# Patient Record
Sex: Female | Born: 1960
Health system: Southern US, Community
[De-identification: ages and names within clinical notes are randomized; demographics above are authoritative.]

## PROBLEM LIST (undated history)

## (undated) DIAGNOSIS — N950 Postmenopausal bleeding: Secondary | ICD-10-CM

## (undated) DIAGNOSIS — F419 Anxiety disorder, unspecified: Secondary | ICD-10-CM

## (undated) DIAGNOSIS — Z780 Asymptomatic menopausal state: Secondary | ICD-10-CM

## (undated) DIAGNOSIS — R739 Hyperglycemia, unspecified: Secondary | ICD-10-CM

## (undated) DIAGNOSIS — F329 Major depressive disorder, single episode, unspecified: Secondary | ICD-10-CM

## (undated) DIAGNOSIS — I10 Essential (primary) hypertension: Secondary | ICD-10-CM

## (undated) DIAGNOSIS — E669 Obesity, unspecified: Secondary | ICD-10-CM

## (undated) DIAGNOSIS — G2581 Restless legs syndrome: Secondary | ICD-10-CM

## (undated) DIAGNOSIS — L0390 Cellulitis, unspecified: Secondary | ICD-10-CM

## (undated) DIAGNOSIS — N926 Irregular menstruation, unspecified: Secondary | ICD-10-CM

## (undated) DIAGNOSIS — J302 Other seasonal allergic rhinitis: Secondary | ICD-10-CM

## (undated) DIAGNOSIS — R5383 Other fatigue: Secondary | ICD-10-CM

## (undated) DIAGNOSIS — E559 Vitamin D deficiency, unspecified: Secondary | ICD-10-CM

## (undated) DIAGNOSIS — E119 Type 2 diabetes mellitus without complications: Secondary | ICD-10-CM

## (undated) HISTORY — DX: Irregular menstruation, unspecified: N92.6

## (undated) HISTORY — DX: Postmenopausal bleeding: N95.0

## (undated) HISTORY — DX: Vitamin D deficiency, unspecified: E55.9

## (undated) HISTORY — DX: Obesity, unspecified: E66.9

## (undated) HISTORY — DX: Major depressive disorder, single episode, unspecified: F32.9

## (undated) HISTORY — DX: Other seasonal allergic rhinitis: J30.2

## (undated) HISTORY — DX: Other fatigue: R53.83

## (undated) HISTORY — DX: Asymptomatic menopausal state: Z78.0

## (undated) HISTORY — DX: Essential (primary) hypertension: I10

## (undated) HISTORY — PX: ANKLE SURGERY: SHX546

## (undated) HISTORY — DX: Cellulitis, unspecified: L03.90

## (undated) HISTORY — DX: Hyperglycemia, unspecified: R73.9

## (undated) HISTORY — DX: Type 2 diabetes mellitus without complications: E11.9

---

## 2001-11-07 ENCOUNTER — Other Ambulatory Visit: Admission: RE | Admit: 2001-11-07 | Discharge: 2001-11-07 | Payer: Self-pay | Admitting: Obstetrics and Gynecology

## 2003-08-02 ENCOUNTER — Ambulatory Visit (HOSPITAL_COMMUNITY): Admission: RE | Admit: 2003-08-02 | Discharge: 2003-08-02 | Payer: Self-pay | Admitting: Specialist

## 2006-08-27 ENCOUNTER — Encounter (HOSPITAL_COMMUNITY): Admission: RE | Admit: 2006-08-27 | Discharge: 2006-09-26 | Payer: Self-pay | Admitting: Orthopaedic Surgery

## 2007-01-27 ENCOUNTER — Ambulatory Visit (HOSPITAL_COMMUNITY): Admission: RE | Admit: 2007-01-27 | Discharge: 2007-01-27 | Payer: Self-pay | Admitting: Specialist

## 2008-01-29 ENCOUNTER — Ambulatory Visit (HOSPITAL_COMMUNITY): Admission: RE | Admit: 2008-01-29 | Discharge: 2008-01-29 | Payer: Self-pay | Admitting: Obstetrics and Gynecology

## 2008-02-05 ENCOUNTER — Other Ambulatory Visit: Admission: RE | Admit: 2008-02-05 | Discharge: 2008-02-05 | Payer: Self-pay | Admitting: Obstetrics and Gynecology

## 2009-01-31 ENCOUNTER — Ambulatory Visit (HOSPITAL_COMMUNITY): Admission: RE | Admit: 2009-01-31 | Discharge: 2009-01-31 | Payer: Self-pay | Admitting: Obstetrics & Gynecology

## 2009-05-27 ENCOUNTER — Other Ambulatory Visit: Admission: RE | Admit: 2009-05-27 | Discharge: 2009-05-27 | Payer: Self-pay | Admitting: Obstetrics and Gynecology

## 2010-02-14 ENCOUNTER — Ambulatory Visit (HOSPITAL_COMMUNITY): Admission: RE | Admit: 2010-02-14 | Discharge: 2010-02-14 | Payer: Self-pay | Admitting: Obstetrics and Gynecology

## 2010-09-02 ENCOUNTER — Encounter: Payer: Self-pay | Admitting: Specialist

## 2010-09-19 ENCOUNTER — Other Ambulatory Visit (HOSPITAL_COMMUNITY)
Admission: RE | Admit: 2010-09-19 | Discharge: 2010-09-19 | Disposition: A | Discharge status: Home / Self Care | Payer: BC Managed Care – PPO | Source: Ambulatory Visit | Attending: Obstetrics and Gynecology | Admitting: Obstetrics and Gynecology

## 2010-09-19 ENCOUNTER — Other Ambulatory Visit: Payer: Self-pay | Admitting: Adult Health

## 2010-09-19 DIAGNOSIS — Z01419 Encounter for gynecological examination (general) (routine) without abnormal findings: Secondary | ICD-10-CM | POA: Insufficient documentation

## 2010-10-30 ENCOUNTER — Other Ambulatory Visit: Payer: Self-pay | Admitting: Obstetrics & Gynecology

## 2010-10-30 DIAGNOSIS — N946 Dysmenorrhea, unspecified: Secondary | ICD-10-CM

## 2010-11-02 ENCOUNTER — Other Ambulatory Visit: Payer: Self-pay | Admitting: Obstetrics & Gynecology

## 2010-11-02 ENCOUNTER — Ambulatory Visit (HOSPITAL_COMMUNITY)
Admission: RE | Admit: 2010-11-02 | Discharge: 2010-11-02 | Disposition: A | Discharge status: Home / Self Care | Payer: BC Managed Care – PPO | Source: Ambulatory Visit | Attending: Obstetrics & Gynecology | Admitting: Obstetrics & Gynecology

## 2010-11-02 ENCOUNTER — Other Ambulatory Visit (HOSPITAL_COMMUNITY): Payer: BC Managed Care – PPO

## 2010-11-02 DIAGNOSIS — N946 Dysmenorrhea, unspecified: Secondary | ICD-10-CM

## 2010-11-02 DIAGNOSIS — N926 Irregular menstruation, unspecified: Secondary | ICD-10-CM | POA: Insufficient documentation

## 2010-11-03 ENCOUNTER — Ambulatory Visit (HOSPITAL_COMMUNITY): Payer: BC Managed Care – PPO

## 2011-01-26 ENCOUNTER — Other Ambulatory Visit: Payer: Self-pay | Admitting: Obstetrics and Gynecology

## 2011-01-26 DIAGNOSIS — Z139 Encounter for screening, unspecified: Secondary | ICD-10-CM

## 2011-02-19 ENCOUNTER — Ambulatory Visit (HOSPITAL_COMMUNITY): Payer: BC Managed Care – PPO

## 2011-03-12 ENCOUNTER — Ambulatory Visit (HOSPITAL_COMMUNITY)
Admission: RE | Admit: 2011-03-12 | Discharge: 2011-03-12 | Disposition: A | Discharge status: Home / Self Care | Payer: BC Managed Care – PPO | Source: Ambulatory Visit | Attending: Obstetrics and Gynecology | Admitting: Obstetrics and Gynecology

## 2011-03-12 DIAGNOSIS — Z1231 Encounter for screening mammogram for malignant neoplasm of breast: Secondary | ICD-10-CM | POA: Insufficient documentation

## 2011-03-12 DIAGNOSIS — Z139 Encounter for screening, unspecified: Secondary | ICD-10-CM

## 2011-10-24 ENCOUNTER — Other Ambulatory Visit: Payer: Self-pay | Admitting: Adult Health

## 2011-10-24 ENCOUNTER — Other Ambulatory Visit (HOSPITAL_COMMUNITY)
Admission: RE | Admit: 2011-10-24 | Discharge: 2011-10-24 | Disposition: A | Discharge status: Home / Self Care | Payer: BC Managed Care – PPO | Source: Ambulatory Visit | Attending: Obstetrics and Gynecology | Admitting: Obstetrics and Gynecology

## 2011-10-24 DIAGNOSIS — Z01419 Encounter for gynecological examination (general) (routine) without abnormal findings: Secondary | ICD-10-CM | POA: Insufficient documentation

## 2012-03-24 ENCOUNTER — Other Ambulatory Visit: Payer: Self-pay | Admitting: Obstetrics and Gynecology

## 2012-03-24 DIAGNOSIS — Z139 Encounter for screening, unspecified: Secondary | ICD-10-CM

## 2012-04-01 ENCOUNTER — Ambulatory Visit (HOSPITAL_COMMUNITY): Payer: BC Managed Care – PPO

## 2012-04-18 ENCOUNTER — Ambulatory Visit (HOSPITAL_COMMUNITY)
Admission: RE | Admit: 2012-04-18 | Discharge: 2012-04-18 | Disposition: A | Discharge status: Home / Self Care | Payer: BC Managed Care – PPO | Source: Ambulatory Visit | Attending: Obstetrics and Gynecology | Admitting: Obstetrics and Gynecology

## 2012-04-18 DIAGNOSIS — Z139 Encounter for screening, unspecified: Secondary | ICD-10-CM

## 2012-04-18 DIAGNOSIS — Z1231 Encounter for screening mammogram for malignant neoplasm of breast: Secondary | ICD-10-CM | POA: Insufficient documentation

## 2012-04-22 ENCOUNTER — Other Ambulatory Visit: Payer: Self-pay | Admitting: Obstetrics and Gynecology

## 2012-04-22 DIAGNOSIS — R928 Other abnormal and inconclusive findings on diagnostic imaging of breast: Secondary | ICD-10-CM

## 2012-05-28 ENCOUNTER — Ambulatory Visit (HOSPITAL_COMMUNITY)
Admission: RE | Admit: 2012-05-28 | Discharge: 2012-05-28 | Disposition: A | Discharge status: Home / Self Care | Payer: BC Managed Care – PPO | Source: Ambulatory Visit | Attending: Obstetrics and Gynecology | Admitting: Obstetrics and Gynecology

## 2012-05-28 DIAGNOSIS — R928 Other abnormal and inconclusive findings on diagnostic imaging of breast: Secondary | ICD-10-CM

## 2012-05-28 DIAGNOSIS — N63 Unspecified lump in unspecified breast: Secondary | ICD-10-CM | POA: Insufficient documentation

## 2012-11-19 ENCOUNTER — Encounter: Payer: Self-pay | Admitting: *Deleted

## 2012-11-19 DIAGNOSIS — E669 Obesity, unspecified: Secondary | ICD-10-CM | POA: Insufficient documentation

## 2012-11-20 ENCOUNTER — Other Ambulatory Visit: Payer: Self-pay | Admitting: Adult Health

## 2012-12-22 ENCOUNTER — Encounter: Payer: Self-pay | Admitting: Adult Health

## 2012-12-22 ENCOUNTER — Other Ambulatory Visit (HOSPITAL_COMMUNITY)
Admission: RE | Admit: 2012-12-22 | Discharge: 2012-12-22 | Disposition: A | Discharge status: Home / Self Care | Payer: BC Managed Care – PPO | Source: Ambulatory Visit | Attending: Adult Health | Admitting: Adult Health

## 2012-12-22 ENCOUNTER — Ambulatory Visit (INDEPENDENT_AMBULATORY_CARE_PROVIDER_SITE_OTHER): Payer: BC Managed Care – PPO | Admitting: Adult Health

## 2012-12-22 VITALS — BP 130/80 | Ht 63.5 in | Wt 227.0 lb

## 2012-12-22 DIAGNOSIS — Z01419 Encounter for gynecological examination (general) (routine) without abnormal findings: Secondary | ICD-10-CM | POA: Insufficient documentation

## 2012-12-22 DIAGNOSIS — Z1212 Encounter for screening for malignant neoplasm of rectum: Secondary | ICD-10-CM

## 2012-12-22 DIAGNOSIS — Z1151 Encounter for screening for human papillomavirus (HPV): Secondary | ICD-10-CM | POA: Insufficient documentation

## 2012-12-22 DIAGNOSIS — L0390 Cellulitis, unspecified: Secondary | ICD-10-CM

## 2012-12-22 HISTORY — DX: Cellulitis, unspecified: L03.90

## 2012-12-22 LAB — HEMOCCULT GUIAC POC 1CARD (OFFICE): Fecal Occult Blood, POC: NEGATIVE

## 2012-12-22 MED ORDER — HYDROCHLOROTHIAZIDE 12.5 MG PO CAPS: 12.5000 mg | ORAL_CAPSULE | Freq: Every day | ORAL | Status: DC

## 2012-12-22 MED ORDER — DOXYCYCLINE HYCLATE 100 MG PO CAPS: 100.0000 mg | ORAL_CAPSULE | Freq: Two times a day (BID) | ORAL | Status: DC

## 2012-12-22 NOTE — Progress Notes (Signed)
Patient ID: Allison Goodman, female   DOB: 1960-12-09, 52 y.o.   MRN: [id number] History of Present Illness: Allison Goodman is a 52 year old white female in for pap and physical. She works at [employer].  Current Medications, Allergies, Past Medical History, Past Surgical History, Family History and Social History were reviewed in [facility] [employer] record.     Review of Systems:Patient denies any headaches, blurred vision, shortness of breath, chest pain, abdominal pain, problems with bowel movements, urination, she is not having sex.She does complain of swelling in her right ankle, which she broke in 2008 and has hardware in, she. She is having irregular periods and some mood swings and occasional hot flashes.   Physical Exam:Blood pressure 130/80, height 5' 3.5" (1.613 m), weight 227 lb (102.967 kg), last menstrual period 11/19/2012. General:  Well developed, well nourished, no acute distress Skin:  Warm and dry Neck:  Midline trachea, normal thyroid Lungs; Clear to auscultation bilaterally Breast:  No dominant palpable mass, retraction, or nipple discharge Cardiovascular: Regular rate and rhythm Abdomen:  Soft, non tender, no hepatosplenomegaly Pelvic:  External genitalia is normal in appearance.  The vagina is normal in appearance. The cervix is bulbous. Pap performed with HPV>  Uterus is felt to be normal size, shape, and contour.  No adnexal masses or tenderness noted. Rectal: Good sphincter tone, no polyps, or hemorrhoids felt.  Hemoccult negative. Extremities: She has pitting edema in right ankle and redness, there is scab at top of scar and some weeping, this has gotten worse in last 3-4 months. Left ankle has mild swelling, no redness. Dr. Despina Goodman in see leg and agrees with referral and antibiotic. Psych: Moody at times, pleasant and cooperative today.  Impression: Yearly exam Cellulitis right leg  Plan: Physical in 1 year  Mammogram in October Labs at work in  June Refer to Dr. Hilda Goodman 5/13 at 3 pm Rx doxycycline 100 mg 1 bid x 14 days and microzide 12.5 mg 1 daily

## 2012-12-22 NOTE — Patient Instructions (Addendum)
TAke doxy 1 bid x 14 days Take HCTZ 1 daily See Dr. Hilda Lias 5/13 at 3 pm after work as soon as can get there Sign up for my chart Return in 1 year for physical

## 2013-03-19 ENCOUNTER — Encounter (HOSPITAL_COMMUNITY): Payer: Self-pay | Admitting: *Deleted

## 2013-03-19 ENCOUNTER — Inpatient Hospital Stay (HOSPITAL_COMMUNITY)
Admission: EM | Admit: 2013-03-19 | Discharge: 2013-03-22 | DRG: 277 | Disposition: A | Discharge status: Home / Self Care | Payer: BC Managed Care – PPO | Attending: Internal Medicine | Admitting: Internal Medicine

## 2013-03-19 ENCOUNTER — Telehealth: Payer: Self-pay | Admitting: Family Medicine

## 2013-03-19 DIAGNOSIS — L03115 Cellulitis of right lower limb: Secondary | ICD-10-CM | POA: Diagnosis present

## 2013-03-19 DIAGNOSIS — I10 Essential (primary) hypertension: Secondary | ICD-10-CM | POA: Diagnosis present

## 2013-03-19 DIAGNOSIS — D72829 Elevated white blood cell count, unspecified: Secondary | ICD-10-CM | POA: Diagnosis present

## 2013-03-19 DIAGNOSIS — E669 Obesity, unspecified: Secondary | ICD-10-CM | POA: Diagnosis present

## 2013-03-19 DIAGNOSIS — E876 Hypokalemia: Secondary | ICD-10-CM | POA: Diagnosis present

## 2013-03-19 DIAGNOSIS — E119 Type 2 diabetes mellitus without complications: Secondary | ICD-10-CM | POA: Diagnosis present

## 2013-03-19 DIAGNOSIS — L0390 Cellulitis, unspecified: Secondary | ICD-10-CM

## 2013-03-19 DIAGNOSIS — Z6837 Body mass index (BMI) 37.0-37.9, adult: Secondary | ICD-10-CM

## 2013-03-19 DIAGNOSIS — L02419 Cutaneous abscess of limb, unspecified: Principal | ICD-10-CM | POA: Diagnosis present

## 2013-03-19 LAB — BASIC METABOLIC PANEL
BUN: 15 mg/dL (ref 6–23)
CO2: 30 mEq/L (ref 19–32)
Calcium: 9.7 mg/dL (ref 8.4–10.5)
Chloride: 101 mEq/L (ref 96–112)
Creatinine, Ser: 0.67 mg/dL (ref 0.50–1.10)
GFR calc Af Amer: >90 mL/min (ref >90)
GFR calc non Af Amer: >90 mL/min (ref >90)
Glucose, Bld: 114 mg/dL — ABNORMAL HIGH (ref 70–99)
Potassium: 3.4 mEq/L — ABNORMAL LOW (ref 3.5–5.1)
Sodium: 141 mEq/L (ref 135–145)

## 2013-03-19 LAB — HEPATIC FUNCTION PANEL
Bilirubin, Direct: <0.1 mg/dL (ref 0.0–0.3)
Total Bilirubin: 0.3 mg/dL (ref 0.3–1.2)
Total Protein: 8.4 g/dL — ABNORMAL HIGH (ref 6.0–8.3)

## 2013-03-19 LAB — CBC WITH DIFFERENTIAL/PLATELET
Basophils Absolute: 0 10*3/uL (ref 0.0–0.1)
Basophils Relative: 0 % (ref 0–1)
Eosinophils Absolute: 0.1 10*3/uL (ref 0.0–0.7)
Eosinophils Relative: 1 % (ref 0–5)
HCT: 39.9 % (ref 36.0–46.0)
Hemoglobin: 13.6 g/dL (ref 12.0–15.0)
Lymphocytes Relative: 14 % (ref 12–46)
Lymphs Abs: 2.1 10*3/uL (ref 0.7–4.0)
MCH: 29.9 pg (ref 26.0–34.0)
MCHC: 34.1 g/dL (ref 30.0–36.0)
MCV: 87.7 fL (ref 78.0–100.0)
Monocytes Absolute: 1.8 10*3/uL — ABNORMAL HIGH (ref 0.1–1.0)
Monocytes Relative: 12 % (ref 3–12)
Neutro Abs: 11 10*3/uL — ABNORMAL HIGH (ref 1.7–7.7)
Neutrophils Relative %: 73 % (ref 43–77)
Platelets: 236 10*3/uL (ref 150–400)
RBC: 4.55 MIL/uL (ref 3.87–5.11)
RDW: 12.5 % (ref 11.5–15.5)
WBC: 15 10*3/uL — ABNORMAL HIGH (ref 4.0–10.5)

## 2013-03-19 LAB — URINALYSIS, ROUTINE W REFLEX MICROSCOPIC
Bilirubin Urine: NEGATIVE
Glucose, UA: NEGATIVE mg/dL

## 2013-03-19 LAB — URINE MICROSCOPIC-ADD ON

## 2013-03-19 MED ORDER — RISAQUAD PO CAPS
2.0000 | ORAL_CAPSULE | Freq: Every day | ORAL | Status: DC
  Administered 2013-03-20 – 2013-03-22 (×3): 2 via ORAL
  Filled 2013-03-19: qty 2
  Filled 2013-03-19: qty 1
  Filled 2013-03-19 (×2): qty 2

## 2013-03-19 MED ORDER — IBUPROFEN 600 MG PO TABS
600.0000 mg | ORAL_TABLET | Freq: Four times a day (QID) | ORAL | Status: DC | PRN
  Administered 2013-03-20: 600 mg via ORAL
  Filled 2013-03-19 (×2): qty 1

## 2013-03-19 MED ORDER — ACETAMINOPHEN 325 MG PO TABS
650.0000 mg | ORAL_TABLET | ORAL | Status: DC | PRN
  Administered 2013-03-21 – 2013-03-22 (×3): 650 mg via ORAL
  Filled 2013-03-19 (×3): qty 2

## 2013-03-19 MED ORDER — TRAZODONE HCL 50 MG PO TABS: 50.0000 mg | ORAL_TABLET | Freq: Every evening | ORAL | Status: DC | PRN

## 2013-03-19 MED ORDER — OXYCODONE-ACETAMINOPHEN 5-325 MG PO TABS
1.0000 | ORAL_TABLET | Freq: Once | ORAL | Status: AC
  Administered 2013-03-19: 1 via ORAL
  Filled 2013-03-19: qty 1

## 2013-03-19 MED ORDER — CLINDAMYCIN PHOSPHATE 600 MG/50ML IV SOLN: 600.0000 mg | Freq: Once | INTRAVENOUS | Status: DC

## 2013-03-19 MED ORDER — PIPERACILLIN-TAZOBACTAM 3.375 G IVPB
3.3750 g | Freq: Three times a day (TID) | INTRAVENOUS | Status: DC
  Administered 2013-03-20 – 2013-03-21 (×5): 3.375 g via INTRAVENOUS
  Filled 2013-03-19 (×11): qty 50

## 2013-03-19 MED ORDER — ONDANSETRON HCL 4 MG/2ML IJ SOLN: 4.0000 mg | INTRAMUSCULAR | Status: DC | PRN

## 2013-03-19 MED ORDER — SODIUM CHLORIDE 0.9 % IV BOLUS (SEPSIS)
500.0000 mL | Freq: Once | INTRAVENOUS | Status: AC
  Administered 2013-03-19: 500 mL via INTRAVENOUS

## 2013-03-19 MED ORDER — ENOXAPARIN SODIUM 40 MG/0.4ML ~~LOC~~ SOLN
40.0000 mg | SUBCUTANEOUS | Status: DC
  Administered 2013-03-20 – 2013-03-21 (×3): 40 mg via SUBCUTANEOUS
  Filled 2013-03-19 (×3): qty 0.4

## 2013-03-19 MED ORDER — CLINDAMYCIN PHOSPHATE 600 MG/50ML IV SOLN
600.0000 mg | Freq: Once | INTRAVENOUS | Status: AC
  Administered 2013-03-19: 600 mg via INTRAVENOUS
  Filled 2013-03-19: qty 50

## 2013-03-19 MED ORDER — VANCOMYCIN HCL IN DEXTROSE 750-5 MG/150ML-% IV SOLN
750.0000 mg | Freq: Three times a day (TID) | INTRAVENOUS | Status: DC
  Administered 2013-03-20 – 2013-03-21 (×4): 750 mg via INTRAVENOUS
  Filled 2013-03-19 (×10): qty 150

## 2013-03-19 MED ORDER — VANCOMYCIN HCL IN DEXTROSE 1-5 GM/200ML-% IV SOLN
1000.0000 mg | Freq: Once | INTRAVENOUS | Status: AC
  Administered 2013-03-20: 1000 mg via INTRAVENOUS
  Filled 2013-03-19: qty 200

## 2013-03-19 MED ORDER — POLYETHYLENE GLYCOL 3350 17 G PO PACK: 17.0000 g | PACK | Freq: Every day | ORAL | Status: DC | PRN

## 2013-03-19 MED ORDER — FLEET ENEMA 7-19 GM/118ML RE ENEM: 1.0000 | ENEMA | Freq: Once | RECTAL | Status: AC | PRN

## 2013-03-19 MED ORDER — SORBITOL 70 % SOLN: 30.0000 mL | Freq: Every day | Status: DC | PRN

## 2013-03-19 NOTE — H&P (Signed)
[facility] History and Physical  Allison Goodman  WUJ:[id number]  DOB: 01-15-1961   DOA: 03/19/2013   PCP:   No PCP Per Patient   Chief Complaint:  Pain and swelling of the right leg for 3  HPI: Allison [employer] is a 52 y.o. female.   Obese Caucasian lady with a history of remote fracture of right ankle about 6 years, status post internal fixation with subsequent right leg swelling since then. Episode of cellulitis of the right leg 3 months ago eventually resolved with one month of doxycycline. Has now developed painful swelling and redness of the right leg after trauma 3 days ago. Denies chills, but has been having generalized aches and low-grade fever as a feeling fluids coming.  In the emergency room and noted to have elevated white and tender warm right leg     Rewiew of Systems:   All systems negative except as marked bold or noted in the HPI;  Constitutional:    malaise, fever and chills. ;  Eyes:   eye pain, redness and discharge. ;  ENMT:   ear pain, hoarseness, nasal congestion, sinus pressure and sore throat. ;  Cardiovascular:    chest pain, palpitations, diaphoresis, dyspnea and peripheral edema.  Respiratory:   cough, hemoptysis, wheezing and stridor. ;  Gastrointestinal: anorexia nausea, vomiting, diarrhea, constipation, abdominal pain, melena, blood in stool, hematemesis, jaundice and rectal bleeding. unusual weight loss..   Genitourinary:    frequency, dysuria, incontinence,flank pain and hematuria; Musculoskeletal:   back pain and neck pain.  swelling and trauma.;  Skin: .  pruritus, rash leg, abrasions, bruising and skin lesion.; ulcerations Neuro:    headache, lightheadedness and neck stiffness.  weakness, altered level of consciousness, altered mental status, extremity weakness, burning feet, involuntary movement, seizure and syncope.  Psych:    anxiety, depression, insomnia, tearfulness, panic attacks, hallucinations, paranoia, suicidal or homicidal  ideation    Past Medical History  Diagnosis Date  . Obesity   . Elevated blood sugar   . Cellulitis 12/22/2012    Rx doxy and refer to Dr. Hilda Lias may need hard ware removed  . Hypertension     Past Surgical History  Procedure Laterality Date  . Ankle surgery Right     Medications:  HOME MEDS: Prior to Admission medications   Medication Sig Start Date End Date Taking? Authorizing Provider  acetaminophen (TYLENOL) 500 MG tablet Take by mouth every 6 (six) hours as needed for pain.   Yes Historical Provider, MD  diphenhydrAMINE (BENADRYL) 25 MG tablet Take 25 mg by mouth once.   Yes Historical Provider, MD  hydrochlorothiazide (MICROZIDE) 12.5 MG capsule Take 1 capsule (12.5 mg total) by mouth daily. 12/22/12  Yes Adline Potter, NP  naproxen sodium (ALEVE) 220 MG tablet Take 220 mg by mouth 2 (two) times daily.   Yes Historical Provider, MD     Allergies:  Allergies  Allergen Reactions  . Morphine And Related Nausea And Vomiting    Social History:   reports that she has never smoked. She has never used smokeless tobacco. She reports that she does not drink alcohol or use illicit drugs.  Family History: Family History  Problem Relation Age of Onset  . COPD Mother   . Diabetes Mother   . Parkinson's disease Father   . Hypertension Other   . Diabetes Brother   . Mitral valve prolapse Sister   . Diabetes Brother   . Diabetes Sister      Physical Exam:  Filed Vitals:   03/19/13 1745 03/19/13 2023  BP: 148/87 137/70  Pulse: 114 86  Temp: 98.7 F (37.1 C) 99.7 F (37.6 C)  TempSrc: Oral Oral  Resp:  18  Height: 5' 3.5" (1.613 m)   Weight: 97.977 kg (216 lb)   SpO2: 99% 97%   Blood pressure 137/70, pulse 86, temperature 99.7 F (37.6 C), temperature source Oral, resp. rate 18, height 5' 3.5" (1.613 m), weight 97.977 kg (216 lb), last menstrual period 02/24/2013, SpO2 97.00%. Body mass index is 37.66 kg/(m^2).   GEN:  Pleasant middle-aged Caucasian lady  lying bed in no acute distress; cooperative with exam PSYCH:  alert and oriented x4;  anxious nor depressed; affect is appropriate. HEENT: Mucous membranes pink and anicteric; PERRLA; EOM intact; no cervical lymphadenopathy nor thyromegaly or carotid bruit; no JVD; Breasts:: Not examined CHEST WALL: No tenderness CHEST: Normal respiration, clear to auscultation bilaterally HEART: Regular rate and rhythm; no murmurs rubs or gallops BACK: No kyphosis no scoliosis; no CVA tenderness ABDOMEN: Obese, soft non-tender; no masses, no organomegaly, normal abdominal bowel sounds;  no intertriginous candida. Rectal Exam: Not done EXTREMITIES: age-appropriate arthropathy of the hands and knees; Right ZOX:WRUEA/[id number]; erythema and distal 2/3; welled healed scars around right ankle. no ulcerations. Genitalia: not examined PULSES: 2+ and symmetric SKIN: Normal hydration no rash or ulceration CNS: Cranial nerves 2-12 grossly intact no focal lateralizing neurologic deficit   Labs on Admission:  Basic Metabolic Panel:  Recent Labs Lab 03/19/13 1843  NA 141  K 3.4*  CL 101  CO2 30  GLUCOSE 114*  BUN 15  CREATININE 0.67  CALCIUM 9.7   Liver Function Tests: No results found for this basename: AST, ALT, ALKPHOS, BILITOT, PROT, ALBUMIN,  in the last 168 hours No results found for this basename: LIPASE, AMYLASE,  in the last 168 hours No results found for this basename: AMMONIA,  in the last 168 hours CBC:  Recent Labs Lab 03/19/13 1843  WBC 15.0*  NEUTROABS 11.0*  HGB 13.6  HCT 39.9  MCV 87.7  PLT 236   Cardiac Enzymes: No results found for this basename: CKTOTAL, CKMB, CKMBINDEX, TROPONINI,  in the last 168 hours BNP: No components found with this basename: POCBNP,  D-dimer: No components found with this basename: D-DIMER,  CBG: No results found for this basename: GLUCAP,  in the last 168 hours  Radiological Exams on Admission: No results  found.    Assessment/Plan    Active Problems:   Obesity   Cellulitis of right leg    PLAN: Admit to start intravenous antibiotics Counseled on weight management including diet and exercise  Other plans as per orders.  Code Status: Full code Family Communication:  Disposition Plan: Likely home in a few days    Santosh Petter Nocturnist [facility] Pager [phone]   03/19/2013, 10:09 PM

## 2013-03-19 NOTE — ED Notes (Signed)
Reports redness and swelling to lateral RLE

## 2013-03-19 NOTE — Progress Notes (Signed)
ANTIBIOTIC CONSULT NOTE - INITIAL  Pharmacy Consult for  Vancomycin & Zosyn  Indication:  Cellulitis right leg  Allergies  Allergen Reactions  . Morphine And Related Nausea And Vomiting    Patient Measurements: Height: 5' 3.5" (161.3 cm) Weight: 218 lb 0.6 oz (98.9 kg) IBW/kg (Calculated) : 53.55 Adjusted Body Weight: 69 kg  Vital Signs: Temp: 98.5 F (36.9 C) (08/07 2200) Temp src: Oral (08/07 2200) BP: 146/87 mmHg (08/07 2200) Pulse Rate: 84 (08/07 2200) Intake/Output from previous day:   Intake/Output from this shift:    Labs:  Recent Labs  03/19/13 1843  WBC 15.0*  HGB 13.6  PLT 236  CREATININE 0.67   Estimated Creatinine Clearance: 96.3 ml/min (by C-G formula based on Cr of 0.67). No results found for this basename: VANCOTROUGH, Leodis Binet, VANCORANDOM, GENTTROUGH, GENTPEAK, GENTRANDOM, TOBRATROUGH, TOBRAPEAK, TOBRARND, AMIKACINPEAK, AMIKACINTROU, AMIKACIN,  in the last 72 hours   Microbiology: Recent Results (from the past 720 hour(s))  CULTURE, BLOOD (ROUTINE X 2)     Status: None   Collection Time    03/19/13  8:21 PM      Result Value Range Status   Specimen Description BLOOD LEFT ANTECUBITAL   Final   Special Requests     Final   Value: BOTTLES DRAWN AEROBIC AND ANAEROBIC AEB 15CC ANA 13CC   Culture PENDING   Incomplete   Report Status PENDING   Incomplete  CULTURE, BLOOD (ROUTINE X 2)     Status: None   Collection Time    03/19/13  8:26 PM      Result Value Range Status   Specimen Description BLOOD LEFT HAND   Final   Special Requests     Final   Value: BOTTLES DRAWN AEROBIC AND ANAEROBIC AEB 8CC ANA 10CC   Culture PENDING   Incomplete   Report Status PENDING   Incomplete    Medical History: Past Medical History  Diagnosis Date  . Obesity   . Elevated blood sugar   . Cellulitis 12/22/2012    Rx doxy and refer to Dr. Hilda Lias may need hard ware removed  . Hypertension     Medications:  Scheduled:  . [START ON 03/20/2013] acidophilus  2  capsule Oral Daily  . enoxaparin (LOVENOX) injection  40 mg Subcutaneous Q24H   Infusions:   PRN: acetaminophen, ibuprofen, ondansetron (ZOFRAN) IV, polyethylene glycol, sodium phosphate, sorbitol, traZODone  Assessment:   39yr female with cellulitis of right leg ( not sure if there is foreign body involved since no notes, but reference to "hardware").  Patient with CrCl wnl for age and condition: probably CrrCl= 80-40ml/min.  Patient previously on oral doxycycline and had one dose clindamycin at 2200.  Goal of Therapy:  Plan routine Zosyn regimen and start of vancomycin regimen with trough of 15 mcg/ml.  Plan:  1.  Zosyn 3.375gm IV q8h (4hr infusions) 2.  Loading dose vancomycin 1gm IV x 1, followed by 3.  Maintenance regimen vancomycin 750mg  IV q8h 4.  Will check steady state vancomycin trough or as clinically needed.  Scarlett Presto 03/19/2013,11:06 PM

## 2013-03-19 NOTE — ED Provider Notes (Signed)
CSN: [id number]     Arrival date & time 03/19/13  1738 History     First MD Initiated Contact with Patient 03/19/13 1805     Chief Complaint  Patient presents with  . Leg Swelling   (Consider location/radiation/quality/duration/timing/severity/associated sxs/prior Treatment) Patient is a 52 y.o. female presenting with rash. The history is provided by the patient.  Rash Location:  Leg Leg rash location:  R lower leg Quality: painful and redness   Pain details:    Quality:  Aching   Severity:  Moderate   Onset quality:  Gradual   Duration:  3 days   Timing:  Constant   Progression:  Worsening Severity:  Moderate Onset quality:  Gradual Duration:  3 days Timing:  Constant Progression:  Worsening Chronicity:  New Context comment:  Bumped her right leg on a car seat several days ago Relieved by:  Nothing Worsened by:  Nothing tried Ineffective treatments:  None tried Associated symptoms: no abdominal pain, no diarrhea, no fatigue, no fever, no headaches, no nausea, no shortness of breath and not vomiting     Past Medical History  Diagnosis Date  . Obesity   . Elevated blood sugar   . Cellulitis 12/22/2012    Rx doxy and refer to Dr. Hilda Lias may need hard ware removed  . Hypertension    Past Surgical History  Procedure Laterality Date  . Ankle surgery Right    Family History  Problem Relation Age of Onset  . COPD Mother   . Diabetes Mother   . Parkinson's disease Father   . Hypertension Other   . Diabetes Brother   . Mitral valve prolapse Sister   . Diabetes Brother   . Diabetes Sister    History  Substance Use Topics  . Smoking status: Never Smoker   . Smokeless tobacco: Never Used  . Alcohol Use: No   OB History   Grav Para Term Preterm Abortions TAB SAB Ect Mult Living   2 2        2      Review of Systems  Constitutional: Negative for fever and fatigue.  HENT: Negative for congestion, drooling and neck pain.   Eyes: Negative for pain.  Respiratory:  Negative for cough and shortness of breath.   Cardiovascular: Negative for chest pain.  Gastrointestinal: Negative for nausea, vomiting, abdominal pain and diarrhea.  Genitourinary: Negative for dysuria and hematuria.  Musculoskeletal: Negative for back pain and gait problem.  Skin: Positive for rash. Negative for color change.  Neurological: Negative for dizziness and headaches.  Hematological: Negative for adenopathy.  Psychiatric/Behavioral: Negative for behavioral problems.  All other systems reviewed and are negative.    Allergies  Morphine and related  Home Medications   Current Outpatient Rx  Name  Route  Sig  Dispense  Refill  . acetaminophen (TYLENOL) 500 MG tablet   Oral   Take by mouth every 6 (six) hours as needed for pain.         . naproxen sodium (ALEVE) 220 MG tablet   Oral   Take 220 mg by mouth 2 (two) times daily.         Marland Kitchen doxycycline (VIBRAMYCIN) 100 MG capsule   Oral   Take 1 capsule (100 mg total) by mouth 2 (two) times daily.   28 capsule   0   . hydrochlorothiazide (MICROZIDE) 12.5 MG capsule   Oral   Take 1 capsule (12.5 mg total) by mouth daily.   30 capsule  11    BP 148/87  Pulse 114  Temp(Src) 98.7 F (37.1 C) (Oral)  Ht 5' 3.5" (1.613 m)  Wt 216 lb (97.977 kg)  BMI 37.66 kg/m2  SpO2 99%  LMP 02/24/2013 Physical Exam  Nursing note and vitals reviewed. Constitutional: She is oriented to person, place, and time. She appears well-developed and well-nourished.  HENT:  Head: Normocephalic.  Mouth/Throat: No oropharyngeal exudate.  Eyes: Conjunctivae and EOM are normal. Pupils are equal, round, and reactive to light.  Neck: Normal range of motion. Neck supple.  Cardiovascular: Regular rhythm, normal heart sounds and intact distal pulses.  Exam reveals no gallop and no friction rub.   No murmur heard. HR 114  Pulmonary/Chest: Effort normal and breath sounds normal. No respiratory distress. She has no wheezes.  Abdominal: Soft.  Bowel sounds are normal. There is no tenderness. There is no rebound and no guarding.  Musculoskeletal: Normal range of motion. She exhibits edema (Moderate non-pitting edema of RLE extending from foot to knee. Pt notes this is unchanged from baseline. ) and tenderness (mild to moderate erythema and mild ttp on right anterior mid shin extending to right ankle and right lateral calf. ).  2+ distal pulses.   Neurological: She is alert and oriented to person, place, and time.  Skin: Skin is warm and dry.  Psychiatric: She has a normal mood and affect. Her behavior is normal.    ED Course   Procedures (including critical care time)  Labs Reviewed  CBC WITH DIFFERENTIAL - Abnormal; Notable for the following:    WBC 15.0 (*)    Neutro Abs 11.0 (*)    Monocytes Absolute 1.8 (*)    All other components within normal limits  BASIC METABOLIC PANEL - Abnormal; Notable for the following:    Potassium 3.4 (*)    Glucose, Bld 114 (*)    All other components within normal limits  URINALYSIS, ROUTINE W REFLEX MICROSCOPIC - Abnormal; Notable for the following:    Specific Gravity, Urine >1.030 (*)    Hgb urine dipstick SMALL (*)    Protein, ur TRACE (*)    All other components within normal limits  URINE MICROSCOPIC-ADD ON - Abnormal; Notable for the following:    Squamous Epithelial / LPF FEW (*)    Bacteria, UA FEW (*)    All other components within normal limits  HEPATIC FUNCTION PANEL - Abnormal; Notable for the following:    Total Protein 8.4 (*)    All other components within normal limits  BASIC METABOLIC PANEL - Abnormal; Notable for the following:    Potassium 3.2 (*)    Glucose, Bld 119 (*)    All other components within normal limits  CBC - Abnormal; Notable for the following:    WBC 11.9 (*)    RBC 3.80 (*)    Hemoglobin 11.5 (*)    HCT 33.7 (*)    All other components within normal limits  CULTURE, BLOOD (ROUTINE X 2)  CULTURE, BLOOD (ROUTINE X 2)  URINE CULTURE  HEMOGLOBIN  A1C   No results found. 1. Cellulitis   2. Cellulitis of right leg   3. Obesity   4. Hypertension     MDM  6:39 PM 52 y.o. female pw erythema to right anterior shin and right lateral calf which began several days ago and worsened today. Pt tachycardic here, denies fever. Notes body aches. Appears to be cellulitis. Will get labs, IVF, percocet for pain.   Will admit to hospitalist  for IV abx.   Junius Argyle, MD 03/20/13 1141

## 2013-03-19 NOTE — Telephone Encounter (Signed)
Offered patient appt for tomorrow morning. Patient states that she is supposed to work and if for some reason she can get off she will call in the morning to make appt. Advised pt that we could give work note but pt wanted to call back

## 2013-03-19 NOTE — ED Notes (Signed)
Report called to floor.  Clarified with ED physician, Dr Romeo Apple that the patient should receive the Clindamycin as previously ordered.  Med pulled from Pyxis and sent to floor with patient to be administered after arrival in room.

## 2013-03-20 DIAGNOSIS — I10 Essential (primary) hypertension: Secondary | ICD-10-CM

## 2013-03-20 DIAGNOSIS — D72829 Elevated white blood cell count, unspecified: Secondary | ICD-10-CM | POA: Diagnosis present

## 2013-03-20 DIAGNOSIS — E876 Hypokalemia: Secondary | ICD-10-CM | POA: Diagnosis present

## 2013-03-20 LAB — CBC
HCT: 33.7 % — ABNORMAL LOW (ref 36.0–46.0)
MCH: 30.3 pg (ref 26.0–34.0)
MCV: 88.7 fL (ref 78.0–100.0)
Platelets: 196 10*3/uL (ref 150–400)
RBC: 3.8 MIL/uL — ABNORMAL LOW (ref 3.87–5.11)
WBC: 11.9 10*3/uL — ABNORMAL HIGH (ref 4.0–10.5)

## 2013-03-20 LAB — BASIC METABOLIC PANEL
CO2: 28 mEq/L (ref 19–32)
Calcium: 8.5 mg/dL (ref 8.4–10.5)
Chloride: 102 mEq/L (ref 96–112)
Creatinine, Ser: 0.64 mg/dL (ref 0.50–1.10)
Glucose, Bld: 119 mg/dL — ABNORMAL HIGH (ref 70–99)

## 2013-03-20 MED ORDER — VANCOMYCIN HCL IN DEXTROSE 750-5 MG/150ML-% IV SOLN
INTRAVENOUS | Status: AC
  Filled 2013-03-20: qty 150

## 2013-03-20 MED ORDER — PIPERACILLIN-TAZOBACTAM 3.375 G IVPB
INTRAVENOUS | Status: AC
  Filled 2013-03-20: qty 50

## 2013-03-20 MED ORDER — POTASSIUM CHLORIDE CRYS ER 20 MEQ PO TBCR
40.0000 meq | EXTENDED_RELEASE_TABLET | ORAL | Status: AC
  Administered 2013-03-20 (×2): 40 meq via ORAL
  Filled 2013-03-20 (×2): qty 2

## 2013-03-20 MED ORDER — VANCOMYCIN HCL IN DEXTROSE 1-5 GM/200ML-% IV SOLN
INTRAVENOUS | Status: AC
  Filled 2013-03-20: qty 200

## 2013-03-20 NOTE — Care Management Note (Signed)
    Page 1 of 1   03/20/2013     2:19:15 PM   CARE MANAGEMENT NOTE 03/20/2013  Patient:  Allison Goodman, Allison Goodman   Account Number:  [id number]  Date Initiated:  03/20/2013  Documentation initiated by:  Rosemary Holms  Subjective/Objective Assessment:   Pt admitted from home where she lives with her two sons ages 48 and 23. No DC HH needs identified or anticipated.     Action/Plan:   Anticipated DC Date:  03/21/2013   Anticipated DC Plan:  HOME/SELF CARE      DC Planning Services  CM consult      Choice offered to / List presented to:             Status of service:  Completed, signed off Medicare Important Message given?   (If response is "NO", the following Medicare IM given date fields will be blank) Date Medicare IM given:   Date Additional Medicare IM given:    Discharge Disposition:    Per UR Regulation:    If discussed at Long Length of Stay Meetings, dates discussed:    Comments:  03/20/13 Rosemary Holms RN BNS CM

## 2013-03-20 NOTE — Progress Notes (Signed)
[facility] PROGRESS NOTE  Allison Goodman ZOX:[id number] DOB: 1961/06/11 DOA: 03/19/2013 PCP: No PCP Per Patient  Assessment/Plan: Cellulitis of right leg :some improvement this am in terms of swelling and pain.Vanc and zosyn day #1. Afebrile and non-toxic appearing.   Leukocytosis: related to #1. Trending downward. Afebrile and non-toxic  Hypokalemia: mild. Replete and recheck.   HTn: fair control. Pt on HCTZ low dose. Holding for now. Will monitor blood pressure and resume when indicated.  Obesity: BMI 37.7. Nutritional consult     1.   Code Status: full Family Communication: none present Disposition Plan: home when ready   Consultants:  none  Procedures:  none  Antibiotics:  "Vanc 03/20/13>>  Zosyn 03/20/13>>  HPI/Subjective: Sitting on side of bed eating breakfast. Reports improvement in swelling and pain of right leg.   Objective: Filed Vitals:   03/20/13 0615  BP: 145/81  Pulse: 74  Temp: 97.4 F (36.3 C)  Resp: 18   No intake or output data in the 24 hours ending 03/20/13 0854 Filed Weights   03/19/13 1745 03/19/13 2200  Weight: 216 lb (97.977 kg) 218 lb 0.6 oz (98.9 kg)    Exam:   General:  Obese NAD  Cardiovascular: RRR no mgr right leg 1+ LE edema left leg without edem  Respiratory: normal effort BS clear bilaterally no wheeze no rhonchi  Abdomen: obese sof t+BS non-tender to palpation  Musculoskeletal: right leg with erythema and swelling from mid shin to ankle. Healing wounds medial and lateral aspect of right ankle. Healed incision to right ankle.    Data Reviewed: Basic Metabolic Panel:  Recent Labs Lab 03/19/13 1843 03/20/13 0508  NA 141 138  K 3.4* 3.2*  CL 101 102  CO2 30 28  GLUCOSE 114* 119*  BUN 15 14  CREATININE 0.67 0.64  CALCIUM 9.7 8.5   Liver Function Tests:  Recent Labs Lab 03/19/13 2219  AST 18  ALT 13  ALKPHOS 93  BILITOT 0.3  PROT 8.4*  ALBUMIN 4.1   No results found for this basename:  LIPASE, AMYLASE,  in the last 168 hours No results found for this basename: AMMONIA,  in the last 168 hours CBC:  Recent Labs Lab 03/19/13 1843 03/20/13 0508  WBC 15.0* 11.9*  NEUTROABS 11.0*  --   HGB 13.6 11.5*  HCT 39.9 33.7*  MCV 87.7 88.7  PLT 236 196   Cardiac Enzymes: No results found for this basename: CKTOTAL, CKMB, CKMBINDEX, TROPONINI,  in the last 168 hours BNP (last 3 results) No results found for this basename: PROBNP,  in the last 8760 hours CBG: No results found for this basename: GLUCAP,  in the last 168 hours  Recent Results (from the past 240 hour(s))  CULTURE, BLOOD (ROUTINE X 2)     Status: None   Collection Time    03/19/13  8:21 PM      Result Value Range Status   Specimen Description BLOOD LEFT ANTECUBITAL   Final   Special Requests     Final   Value: BOTTLES DRAWN AEROBIC AND ANAEROBIC AEB 15CC ANA 13CC   Culture PENDING   Incomplete   Report Status PENDING   Incomplete  CULTURE, BLOOD (ROUTINE X 2)     Status: None   Collection Time    03/19/13  8:26 PM      Result Value Range Status   Specimen Description BLOOD LEFT HAND   Final   Special Requests     Final  Value: BOTTLES DRAWN AEROBIC AND ANAEROBIC AEB 8CC ANA 10CC   Culture PENDING   Incomplete   Report Status PENDING   Incomplete     Studies: No results found.  Scheduled Meds: . acidophilus  2 capsule Oral Daily  . enoxaparin (LOVENOX) injection  40 mg Subcutaneous Q24H  . piperacillin-tazobactam (ZOSYN)  IV  3.375 g Intravenous Q8H  . potassium chloride  40 mEq Oral Q4H  . vancomycin  750 mg Intravenous Q8H   Continuous Infusions:   Active Problems:   Obesity   Cellulitis of right leg HTN Leukocytosis hypokalemia    Time spent: 30 minutes    [facility] M  [facility] Pager [phone]. If 7PM-7AM, please contact night-coverage at [email or web address], password [facility] 03/20/2013, 8:54 AM  LOS: 1 day   Attending: Patient seen and examined. The right leg cellulitis has  improved, according to the patient, even since yesterday. Anticipate that she could probably go home in the next 24-48 hours based on continued improvement. Agree with intravenous vancomycin and Zosyn.

## 2013-03-21 LAB — BASIC METABOLIC PANEL
BUN: 8 mg/dL (ref 6–23)
CO2: 29 mEq/L (ref 19–32)
Calcium: 8.7 mg/dL (ref 8.4–10.5)
Chloride: 104 mEq/L (ref 96–112)
Creatinine, Ser: 0.64 mg/dL (ref 0.50–1.10)

## 2013-03-21 LAB — CBC
HCT: 35.3 % — ABNORMAL LOW (ref 36.0–46.0)
MCH: 29.7 pg (ref 26.0–34.0)
MCHC: 33.4 g/dL (ref 30.0–36.0)
MCV: 88.9 fL (ref 78.0–100.0)
Platelets: 226 10*3/uL (ref 150–400)
RDW: 12.6 % (ref 11.5–15.5)
WBC: 8.1 10*3/uL (ref 4.0–10.5)

## 2013-03-21 LAB — URINE CULTURE: Colony Count: NO GROWTH

## 2013-03-21 MED ORDER — LEVOFLOXACIN 750 MG PO TABS
750.0000 mg | ORAL_TABLET | Freq: Every day | ORAL | Status: DC
  Administered 2013-03-21 – 2013-03-22 (×2): 750 mg via ORAL
  Filled 2013-03-21 (×2): qty 1

## 2013-03-21 NOTE — Progress Notes (Signed)
Allison Goodman:[id number] DOB: 05-27-1961 DOA: 03/19/2013 PCP: No PCP Per Patient   Subjective: This lady, with a right leg cellulitis, says that her right leg has not improved significantly from yesterday. Otherwise she feels well.           Physical Exam: Blood pressure 133/70, pulse 79, temperature 98.1 F (36.7 C), temperature source Oral, resp. rate 20, height 5' 3.5" (1.613 m), weight 98.9 kg (218 lb 0.6 oz), last menstrual period 02/24/2013, SpO2 99.00%. She looks systemically well. She is not toxic or septic. The right leg cellulitis indeed is no better than yesterday, however it is better than on admission.   Investigations:  Recent Results (from the past 240 hour(s))  CULTURE, BLOOD (ROUTINE X 2)     Status: None   Collection Time    03/19/13  8:21 PM      Result Value Range Status   Specimen Description BLOOD LEFT ANTECUBITAL   Final   Special Requests     Final   Value: BOTTLES DRAWN AEROBIC AND ANAEROBIC AEB=15CC ANA=13CC   Culture NO GROWTH 2 DAYS   Final   Report Status PENDING   Incomplete  CULTURE, BLOOD (ROUTINE X 2)     Status: None   Collection Time    03/19/13  8:26 PM      Result Value Range Status   Specimen Description BLOOD LEFT HAND   Final   Special Requests     Final   Value: BOTTLES DRAWN AEROBIC AND ANAEROBIC AEB=8CC ANA=10CC   Culture NO GROWTH 2 DAYS   Final   Report Status PENDING   Incomplete  URINE CULTURE     Status: None   Collection Time    03/19/13  8:55 PM      Result Value Range Status   Specimen Description URINE, CLEAN CATCH   Final   Special Requests NONE   Final   Culture  Setup Time     Final   Value: 03/19/2013 21:00     Performed at [facility] Count     Final   Value: NO GROWTH     Performed at [facility]   Culture     Final   Value: NO GROWTH     Performed at [facility]   Report Status 03/21/2013 FINAL   Final     Basic Metabolic Panel:  Recent Labs   03/20/13 0508 03/21/13 0613  NA 138 139  K 3.2* 3.9  CL 102 104  CO2 28 29  GLUCOSE 119* 131*  BUN 14 8  CREATININE 0.64 0.64  CALCIUM 8.5 8.7   Liver Function Tests:  Recent Labs  03/19/13 2219  AST 18  ALT 13  ALKPHOS 93  BILITOT 0.3  PROT 8.4*  ALBUMIN 4.1     CBC:  Recent Labs  03/19/13 1843 03/20/13 0508 03/21/13 0613  WBC 15.0* 11.9* 8.1  NEUTROABS 11.0*  --   --   HGB 13.6 11.5* 11.8*  HCT 39.9 33.7* 35.3*  MCV 87.7 88.7 88.9  PLT 236 196 226    No results found.    Medications: I have reviewed the patient's current medications.  Impression: 1. Cellulitis of the right leg, slow to improve. 2. Hypertension. 3. Obesity.     Plan: 1. Continue with intravenous vancomycin and Zosyn.  Consultants:  None.   Procedures:  None.   Antibiotics:  Intravenous vancomycin started 03/19/2013   Intravenous Zosyn started 03/19/2013.  Code Status: Full code.  Family Communication: Discuss plan with patient at the bedside.   Disposition Plan: Home when medically stable.  Time spent: 10 minutes.   LOS: 2 days   Wilson Singer Pager [phone]  03/21/2013, 10:07 AM

## 2013-03-21 NOTE — Plan of Care (Signed)
Problem: Phase I Progression Outcomes Goal: Wound assessment- dressing change as appropriate Talked about dressing change options and what seems to help the most.

## 2013-03-22 MED ORDER — LEVOFLOXACIN 750 MG PO TABS: 750.0000 mg | ORAL_TABLET | Freq: Every day | ORAL | Status: DC

## 2013-03-22 NOTE — Plan of Care (Signed)
Problem: Discharge Progression Outcomes Goal: Other Discharge Outcomes/Goals Outcome: Completed/Met Date Met:  03/22/13 Discharged to home with family

## 2013-03-22 NOTE — Progress Notes (Signed)
[facility] SURGICAL UNIT [id number] [place] [address] Phone: [phone] Fax: [phone]  March 22, 2013  Patient: Allison Goodman  Date of Birth: [birth year]  Date of Visit: 03/19/2013    To Whom It May Concern:  Allison Goodman was seen and treated in our hospital on 03/19/2013 and discharged on 03/23/2039. Allison Goodman  can return to work on 03/30/2013.  Sincerely,

## 2013-03-22 NOTE — Discharge Summary (Signed)
Physician Discharge Summary  Allison Goodman ZOX:[id number] DOB: July 20, 1961 DOA: 03/19/2013  PCP: No PCP Per Patient  Admit date: 03/19/2013 Discharge date: 03/22/2013  Time spent: Greater than 30 minutes  Recommendations for Outpatient Follow-up:  1. Follow with Dr. Sudie Bailey as a new primary care physician.  Discharge Diagnoses:  1. Cellulitis of the right leg, improving. 2. Hypertension. 3. Newly diagnosed diabetes mellitus. 4. Obesity.   Discharge Condition: Stable and improving.  Diet recommendation: Carbohydrate modified diet.  Filed Weights   03/19/13 1745 03/19/13 2200  Weight: 97.977 kg (216 lb) 98.9 kg (218 lb 0.6 oz)    History of present illness:  This 52 year old lady presented to the hospital with symptoms of swelling of the right leg for 3 days. He see initial history as outlined below: HPI:  Allison Goodman is a 52 y.o. female. Obese Caucasian lady with a history of remote fracture of right ankle about 6 years, status post internal fixation with subsequent right leg swelling since then. Episode of cellulitis of the right leg 3 months ago eventually resolved with one month of doxycycline. Has now developed painful swelling and redness of the right leg after trauma 3 days ago. Denies chills, but has been having generalized aches and low-grade fever as a feeling fluids coming.  In the emergency room and noted to have elevated white and tender warm right leg  Hospital Course:  She was admitted and treated with intravenous antibiotics vancomycin and Zosyn. This improved her cellulitis but not completely resolved. She was then converted to oral Levaquin. The cellulitis is continuing to improve but slowly. She was also noted to have a fasting glucose of 131 and hemoglobin of 6.2%, making her diabetic. She will need followup of this with a primary care physician.she is stable for discharge now.   Procedures:  None.    Consultations:  None.   Discharge Exam: Filed  Vitals:   03/22/13 0510  BP: 129/80  Pulse: 86  Temp: 97.7 F (36.5 C)  Resp: 18    General: She looks systemically well. She is not toxic or septic.  Cardiovascular: Heart sounds are present without murmurs or added sounds.  Respiratory: Lung fields are clear. The right lower leg has cellulitis up to the mid calf.  Discharge Instructions  Discharge Orders   Future Orders Complete By Expires     Diet - low sodium heart healthy  As directed     Increase activity slowly  As directed         Medication List    STOP taking these medications       hydrochlorothiazide 12.5 MG capsule  Commonly known as:  MICROZIDE      TAKE these medications       acetaminophen 500 MG tablet  Commonly known as:  TYLENOL  Take by mouth every 6 (six) hours as needed for pain.     ALEVE 220 MG tablet  Generic drug:  naproxen sodium  Take 220 mg by mouth 2 (two) times daily.     diphenhydrAMINE 25 MG tablet  Commonly known as:  BENADRYL  Take 25 mg by mouth once.     levofloxacin 750 MG tablet  Commonly known as:  LEVAQUIN  Take 1 tablet (750 mg total) by mouth daily.       Allergies  Allergen Reactions  . Morphine And Related Nausea And Vomiting       Follow-up Information   Follow up with Milana Obey, MD. Call in 2 days. (  New patient.)    Contact information:   [address] [place] [address] [phone]        The results of significant diagnostics from this hospitalization (including imaging, microbiology, ancillary and laboratory) are listed below for reference.    Significant Diagnostic Studies: No results found.  Microbiology: Recent Results (from the past 240 hour(s))  CULTURE, BLOOD (ROUTINE X 2)     Status: None   Collection Time    03/19/13  8:21 PM      Result Value Range Status   Specimen Description BLOOD LEFT ANTECUBITAL   Final   Special Requests     Final   Value: BOTTLES DRAWN AEROBIC AND ANAEROBIC AEB=15CC ANA=13CC    Culture NO GROWTH 3 DAYS   Final   Report Status PENDING   Incomplete  CULTURE, BLOOD (ROUTINE X 2)     Status: None   Collection Time    03/19/13  8:26 PM      Result Value Range Status   Specimen Description BLOOD LEFT HAND   Final   Special Requests     Final   Value: BOTTLES DRAWN AEROBIC AND ANAEROBIC AEB=8CC ANA=10CC   Culture NO GROWTH 3 DAYS   Final   Report Status PENDING   Incomplete  URINE CULTURE     Status: None   Collection Time    03/19/13  8:55 PM      Result Value Range Status   Specimen Description URINE, CLEAN CATCH   Final   Special Requests NONE   Final   Culture  Setup Time     Final   Value: 03/19/2013 21:00     Performed at [facility] Count     Final   Value: NO GROWTH     Performed at [facility]   Culture     Final   Value: NO GROWTH     Performed at [facility]   Report Status 03/21/2013 FINAL   Final     Labs: Basic Metabolic Panel:  Recent Labs Lab 03/19/13 1843 03/20/13 0508 03/21/13 0613  NA 141 138 139  K 3.4* 3.2* 3.9  CL 101 102 104  CO2 30 28 29   GLUCOSE 114* 119* 131*  BUN 15 14 8   CREATININE 0.67 0.64 0.64  CALCIUM 9.7 8.5 8.7   Liver Function Tests:  Recent Labs Lab 03/19/13 2219  AST 18  ALT 13  ALKPHOS 93  BILITOT 0.3  PROT 8.4*  ALBUMIN 4.1     CBC:  Recent Labs Lab 03/19/13 1843 03/20/13 0508 03/21/13 0613  WBC 15.0* 11.9* 8.1  NEUTROABS 11.0*  --   --   HGB 13.6 11.5* 11.8*  HCT 39.9 33.7* 35.3*  MCV 87.7 88.7 88.9  PLT 236 196 226        Signed:  GOSRANI,NIMISH C  [facility] 03/22/2013, 12:02 PM

## 2013-03-23 NOTE — Progress Notes (Signed)
UR chart review completed.  

## 2013-03-24 LAB — CULTURE, BLOOD (ROUTINE X 2): Culture: NO GROWTH

## 2013-04-07 ENCOUNTER — Other Ambulatory Visit (HOSPITAL_COMMUNITY): Payer: Self-pay | Admitting: Family Medicine

## 2013-04-07 DIAGNOSIS — L0390 Cellulitis, unspecified: Secondary | ICD-10-CM

## 2013-04-08 ENCOUNTER — Ambulatory Visit (HOSPITAL_COMMUNITY)
Admission: RE | Admit: 2013-04-08 | Discharge: 2013-04-08 | Disposition: A | Discharge status: Home / Self Care | Payer: BC Managed Care – PPO | Source: Ambulatory Visit | Attending: Family Medicine | Admitting: Family Medicine

## 2013-04-08 DIAGNOSIS — L0390 Cellulitis, unspecified: Secondary | ICD-10-CM

## 2013-04-08 DIAGNOSIS — L02419 Cutaneous abscess of limb, unspecified: Secondary | ICD-10-CM | POA: Insufficient documentation

## 2013-04-10 ENCOUNTER — Other Ambulatory Visit (HOSPITAL_COMMUNITY): Payer: BC Managed Care – PPO

## 2013-05-18 ENCOUNTER — Other Ambulatory Visit: Payer: Self-pay | Admitting: *Deleted

## 2013-05-18 DIAGNOSIS — M7989 Other specified soft tissue disorders: Secondary | ICD-10-CM

## 2013-05-27 ENCOUNTER — Encounter: Payer: Self-pay | Admitting: Vascular Surgery

## 2013-05-28 ENCOUNTER — Ambulatory Visit (INDEPENDENT_AMBULATORY_CARE_PROVIDER_SITE_OTHER): Payer: BC Managed Care – PPO | Admitting: Vascular Surgery

## 2013-05-28 ENCOUNTER — Ambulatory Visit (HOSPITAL_COMMUNITY)
Admission: RE | Admit: 2013-05-28 | Discharge: 2013-05-28 | Disposition: A | Discharge status: Home / Self Care | Payer: BC Managed Care – PPO | Source: Ambulatory Visit | Attending: Vascular Surgery | Admitting: Vascular Surgery

## 2013-05-28 ENCOUNTER — Encounter: Payer: Self-pay | Admitting: Vascular Surgery

## 2013-05-28 VITALS — BP 148/94 | HR 86 | Ht 63.5 in | Wt 222.3 lb

## 2013-05-28 DIAGNOSIS — M79609 Pain in unspecified limb: Secondary | ICD-10-CM

## 2013-05-28 DIAGNOSIS — M7989 Other specified soft tissue disorders: Secondary | ICD-10-CM | POA: Insufficient documentation

## 2013-05-28 NOTE — Progress Notes (Signed)
[facility] [place] HISTORY AND PHYSICAL   History of Present Illness:  Patient is a 52 y.o. year old female who presents for evaluation of right leg swelling. She has been treated with multiple antibiotics for a right leg cellulitis since August of 2014. She was originally on Levaquin. This was followed by doxycycline. She is currently on cefuroxime. The patient denies any prior history of DVT. She has had significant trauma to the right lower extremity in the past and had plate and screws placed in her right ankle and evening 2008. She was seen by orthopedic surgeon in need an sometime between May and July and what sounds like a plain film was taken which showed the hardware looked okay. No records or x-rays are available for review for the office visit today.  The patient has intermittent pain and swelling in the right leg. She states that the redness in her left leg has slowly improved. Other medical problems include diabetes, hypertension and obesity all of which are currently stable.  Past Medical History  Diagnosis Date  . Obesity   . Elevated blood sugar   . Cellulitis 12/22/2012    Rx doxy and refer to Dr. Hilda Lias may need hard ware removed  . Hypertension   . Diabetes mellitus without complication     Past Surgical History  Procedure Laterality Date  . Ankle surgery Right     screws placed    Social History History  Substance Use Topics  . Smoking status: Never Smoker   . Smokeless tobacco: Never Used  . Alcohol Use: No    Family History Family History  Problem Relation Age of Onset  . COPD Mother   . Diabetes Mother   . Hyperlipidemia Mother   . Hypertension Mother   . Parkinson's disease Father   . Hypertension Other   . Diabetes Brother   . Hyperlipidemia Brother   . Mitral valve prolapse Sister   . Diabetes Brother   . Hyperlipidemia Brother   . Diabetes Sister   . Hyperlipidemia Sister   . Hypertension Sister   . Heart disease Sister      before age 58    Allergies  Allergies  Allergen Reactions  . Morphine And Related Nausea And Vomiting     Current Outpatient Prescriptions  Medication Sig Dispense Refill  . cefUROXime (CEFTIN) 500 MG tablet Take 500 mg by mouth 2 (two) times daily.      . cetirizine (ZYRTEC) 10 MG tablet Take 10 mg by mouth daily.      Marland Kitchen HYDROcodone-acetaminophen (NORCO) 7.5-325 MG per tablet Take 1 tablet by mouth every 6 (six) hours as needed for pain.      Marland Kitchen LORazepam (ATIVAN) 1 MG tablet Take 1 mg by mouth. 1-1/2 tablet at bedtime      . acetaminophen (TYLENOL) 500 MG tablet Take by mouth every 6 (six) hours as needed for pain.      . diphenhydrAMINE (BENADRYL) 25 MG tablet Take 25 mg by mouth once.      Marland Kitchen levofloxacin (LEVAQUIN) 750 MG tablet Take 1 tablet (750 mg total) by mouth daily.  10 tablet  0  . naproxen sodium (ALEVE) 220 MG tablet Take 220 mg by mouth 2 (two) times daily.       No current facility-administered medications for this visit.    ROS:   General:  No weight loss, Fever, chills  HEENT: No recent headaches, no nasal bleeding, no visual changes, no sore throat  Neurologic: No dizziness, blackouts, seizures. No recent symptoms of stroke or mini- stroke. No recent episodes of slurred speech, or temporary blindness.  Cardiac: No recent episodes of chest pain/pressure, no shortness of breath at rest.  No shortness of breath with exertion.  Denies history of atrial fibrillation or irregular heartbeat  Vascular: No history of rest pain in feet.  No history of claudication.  No history of non-healing ulcer, No history of DVT   Pulmonary: No home oxygen, no productive cough, no hemoptysis,  No asthma or wheezing  Musculoskeletal:  [ ]  Arthritis, [ ]  Low back pain,  [ ]  Joint pain  Hematologic:No history of hypercoagulable state.  No history of easy bleeding.  No history of anemia  Gastrointestinal: No hematochezia or melena,  No gastroesophageal reflux, no trouble  swallowing  Urinary: [ ]  chronic Kidney disease, [ ]  on HD - [ ]  MWF or [ ]  TTHS, [ ]  Burning with urination, [ ]  Frequent urination, [ ]  Difficulty urinating;   Skin: No rashes  Psychological: No history of anxiety,  No history of depression   Physical Examination  Filed Vitals:   05/28/13 1418  BP: 148/94  Pulse: 86  Height: 5' 3.5" (1.613 m)  Weight: 222 lb 4.8 oz (100.835 kg)  SpO2: 100%    Body mass index is 38.76 kg/(m^2).  General:  Alert and oriented, no acute distress HEENT: Normal Neck: No bruit or JVD Pulmonary: Clear to auscultation bilaterally Cardiac: Regular Rate and Rhythm without murmur Abdomen: Soft, non-tender, non-distended, no mass Skin: No rash, erythema versus brawny stain circumferentially right lower two thirds of calf not extending into the foot, bilateral malleolar scars right foot Extremity Pulses:  2+ radial, brachial, femoral, dorsalis pedis, posterior tibial pulses bilaterally Musculoskeletal: No deformity trace edema right leg without extension the foot  Neurologic: Upper and lower extremity motor 5/5 and symmetric  DATA:  Patient had a venous duplex exam performed today. This showed a chronic DVT in the right mid to superficial femoral vein and proximal popliteal vein there was evidence of deep venous reflux in the right lower extremity. The superficial venous system was competent. I reviewed and interpreted this study.   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   PLAN:  Bilateral lower extremity 25-30 mm compression stockings were prescribed the patient today to give her symptomatic relief. We will obtain an MRI of her right leg with and without contrast to rule out any deep infection. If the MRI is negative I would treat this as a post phlebitic leg with long-term compression and most likely  for a bionics can be stopped. She will be at risk long-term for skin breakdown and exacerbations and remissions of her current symptoms. At this point the DVT appears chronic and I do not believe it needs anticoagulation. She will followup with me after her MRI scan.  Fabienne Bruns, MD [facility] Office: [phone] Pager: [phone]

## 2013-06-01 ENCOUNTER — Ambulatory Visit
Admission: RE | Admit: 2013-06-01 | Discharge: 2013-06-01 | Disposition: A | Discharge status: Home / Self Care | Payer: BC Managed Care – PPO | Source: Ambulatory Visit | Attending: Vascular Surgery | Admitting: Vascular Surgery

## 2013-06-01 DIAGNOSIS — M79609 Pain in unspecified limb: Secondary | ICD-10-CM

## 2013-06-01 DIAGNOSIS — M7989 Other specified soft tissue disorders: Secondary | ICD-10-CM

## 2013-06-01 MED ORDER — GADOBENATE DIMEGLUMINE 529 MG/ML IV SOLN
20.0000 mL | Freq: Once | INTRAVENOUS | Status: AC | PRN
  Administered 2013-06-01: 20 mL via INTRAVENOUS

## 2013-06-03 ENCOUNTER — Other Ambulatory Visit: Payer: Self-pay | Admitting: Adult Health

## 2013-06-03 DIAGNOSIS — Z139 Encounter for screening, unspecified: Secondary | ICD-10-CM

## 2013-06-10 ENCOUNTER — Encounter: Payer: Self-pay | Admitting: Vascular Surgery

## 2013-06-11 ENCOUNTER — Encounter: Payer: Self-pay | Admitting: Vascular Surgery

## 2013-06-11 ENCOUNTER — Ambulatory Visit (INDEPENDENT_AMBULATORY_CARE_PROVIDER_SITE_OTHER): Payer: BC Managed Care – PPO | Admitting: Vascular Surgery

## 2013-06-11 VITALS — BP 147/91 | HR 95 | Ht 63.5 in | Wt 224.0 lb

## 2013-06-11 DIAGNOSIS — M7989 Other specified soft tissue disorders: Secondary | ICD-10-CM

## 2013-06-11 NOTE — Progress Notes (Signed)
[facility] [place] HISTORY AND PHYSICAL    History of Present Illness:  Allison Goodman is a 52 y.o. year old female who presents for evaluation of right leg swelling. She has been treated with multiple antibiotics for a right leg cellulitis since August of 2014. She was originally on Levaquin. This was followed by doxycycline. She is currently on cefuroxime. The Allison Goodman denies any prior history of DVT. She has had significant trauma to the right lower extremity in the past and had plate and screws placed in her right ankle and evening 2008. She was seen by orthopedic surgeon in need an sometime between May and July and what sounds like a plain film was taken which showed the hardware looked okay.   The Allison Goodman has intermittent pain and swelling in the right leg. She states that the redness in her left leg has slowly improved. Other medical problems include diabetes, hypertension and obesity all of which are currently stable.  She returns today after followup of an MRI to examine her right leg.    Past Medical History   Diagnosis  Date   .  Obesity     .  Elevated blood sugar     .  Cellulitis  12/22/2012       Rx doxy and refer to Dr. Hilda Lias may need hard ware removed   .  Hypertension     .  Diabetes mellitus without complication         Past Surgical History   Procedure  Laterality  Date   .  Ankle surgery  Right         screws placed     Social History History   Substance Use Topics   .  Smoking status:  Never Smoker    .  Smokeless tobacco:  Never Used   .  Alcohol Use:  No     Family History Family History   Problem  Relation  Age of Onset   .  COPD  Mother     .  Diabetes  Mother     .  Hyperlipidemia  Mother     .  Hypertension  Mother     .  Parkinson's disease  Father     .  Hypertension  Other     .  Diabetes  Brother     .  Hyperlipidemia  Brother     .  Mitral valve prolapse  Sister     .  Diabetes  Brother     .  Hyperlipidemia  Brother     .   Diabetes  Sister     .  Hyperlipidemia  Sister     .  Hypertension  Sister     .  Heart disease  Sister         before age 7     Allergies    Allergies   Allergen  Reactions   .  Morphine And Related  Nausea And Vomiting        Current Outpatient Prescriptions   Medication  Sig  Dispense  Refill   .  cefUROXime (CEFTIN) 500 MG tablet  Take 500 mg by mouth 2 (two) times daily.         .  cetirizine (ZYRTEC) 10 MG tablet  Take 10 mg by mouth daily.         Marland Kitchen  HYDROcodone-acetaminophen (NORCO) 7.5-325 MG per tablet  Take 1 tablet by mouth every 6 (six) hours as  needed for pain.         Marland Kitchen  LORazepam (ATIVAN) 1 MG tablet  Take 1 mg by mouth. 1-1/2 tablet at bedtime         .  acetaminophen (TYLENOL) 500 MG tablet  Take by mouth every 6 (six) hours as needed for pain.         .  diphenhydrAMINE (BENADRYL) 25 MG tablet  Take 25 mg by mouth once.         Marland Kitchen  levofloxacin (LEVAQUIN) 750 MG tablet  Take 1 tablet (750 mg total) by mouth daily.   10 tablet   0   .  naproxen sodium (ALEVE) 220 MG tablet  Take 220 mg by mouth 2 (two) times daily.            No current facility-administered medications for this visit.     ROS:    General:  No weight loss, Fever, chills  Skin: No rashes   Physical Examination    Filed Vitals:   06/11/13 1153  BP: 147/91  Pulse: 95  Height: 5' 3.5" (1.613 m)  Weight: 224 lb (101.606 kg)  SpO2: 100%    General:  Alert and oriented, no acute distress HEENT: Normal Neck: No bruit or JVD Pulmonary: Clear to auscultation bilaterally Cardiac: Regular Rate and Rhythm without murmur Abdomen: Soft, non-tender, non-distended, no mass Skin: No rash, erythema versus brawny stain circumferentially right lower two thirds of calf not extending into the foot, bilateral malleolar scars right foot Extremity Pulses:  2+ radial, brachial, femoral, dorsalis pedis, posterior tibial pulses bilaterally Musculoskeletal: No deformity trace edema right leg without  extension the foot        Neurologic: Upper and lower extremity motor 5/5 and symmetric  DATA:  Allison Goodman had a venous duplex exam performed at her previous office visit. This showed a chronic DVT in the right mid to superficial femoral vein and proximal popliteal vein there was evidence of deep venous reflux in the right lower extremity. The superficial venous system was competent. I reviewed her MRI scan today. This showed evidence of edema but no obvious involvement of her hardware in the leg.   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  PLAN:  Bilateral lower extremity 20-30 mm compression stockings were prescribed the Allison Goodman today to give her symptomatic relief.  I would treat this as a post phlebitic leg with long-term compression and most likely for a bionics can be stopped. She will be at risk long-term for skin breakdown and exacerbations and remissions of her current symptoms. At this point the DVT appears chronic and I do not believe it needs anticoagulation. She will followup with me on an as-needed basis   Fabienne Bruns, MD [facility] Office: [phone] Pager: [phone]

## 2013-06-12 ENCOUNTER — Ambulatory Visit (HOSPITAL_COMMUNITY): Payer: BC Managed Care – PPO

## 2013-06-18 ENCOUNTER — Other Ambulatory Visit: Payer: Self-pay

## 2013-08-10 ENCOUNTER — Ambulatory Visit (HOSPITAL_COMMUNITY): Payer: BC Managed Care – PPO

## 2013-08-18 ENCOUNTER — Ambulatory Visit (HOSPITAL_COMMUNITY): Payer: BC Managed Care – PPO

## 2013-09-04 ENCOUNTER — Ambulatory Visit (HOSPITAL_COMMUNITY)
Admission: RE | Admit: 2013-09-04 | Discharge: 2013-09-04 | Disposition: A | Discharge status: Home / Self Care | Payer: BC Managed Care – PPO | Source: Ambulatory Visit | Attending: Adult Health | Admitting: Adult Health

## 2013-09-04 DIAGNOSIS — Z1231 Encounter for screening mammogram for malignant neoplasm of breast: Secondary | ICD-10-CM | POA: Insufficient documentation

## 2013-09-04 DIAGNOSIS — Z139 Encounter for screening, unspecified: Secondary | ICD-10-CM

## 2013-12-24 ENCOUNTER — Encounter: Payer: Self-pay | Admitting: Adult Health

## 2013-12-24 ENCOUNTER — Ambulatory Visit (INDEPENDENT_AMBULATORY_CARE_PROVIDER_SITE_OTHER): Payer: BC Managed Care – PPO | Admitting: Adult Health

## 2013-12-24 VITALS — BP 140/82 | HR 76 | Ht 63.5 in | Wt 228.5 lb

## 2013-12-24 DIAGNOSIS — Z1212 Encounter for screening for malignant neoplasm of rectum: Secondary | ICD-10-CM

## 2013-12-24 DIAGNOSIS — Z01419 Encounter for gynecological examination (general) (routine) without abnormal findings: Secondary | ICD-10-CM

## 2013-12-24 DIAGNOSIS — Z780 Asymptomatic menopausal state: Secondary | ICD-10-CM

## 2013-12-24 HISTORY — DX: Asymptomatic menopausal state: Z78.0

## 2013-12-24 LAB — HEMOCCULT GUIAC POC 1CARD (OFFICE): Fecal Occult Blood, POC: NEGATIVE

## 2013-12-24 MED ORDER — ESCITALOPRAM OXALATE 10 MG PO TABS: 10.0000 mg | ORAL_TABLET | Freq: Every day | ORAL | Status: DC

## 2013-12-24 NOTE — Progress Notes (Signed)
Patient ID: Allison Goodman, female   DOB: 06/02/1961, 53 y.o.   MRN: [id number] History of Present Illness: Allison Goodman is a 53 year old white female in for a physical, she had a pap that was normal with negative HPV 12/22/12.   Current Medications, Allergies, Past Medical History, Past Surgical History, Family History and Social History were reviewed in [facility] electronic medical record.     Review of Systems: Patient denies any headaches, blurred vision, shortness of breath, chest pain, abdominal pain, problems with bowel movements, urination, or intercourse. She has mood swings and hot flashes and periods more irregular and more cramps, but they are light.Has been in hospital in last year for cellulitis right leg.Has had some depression and not sleeping well after hospitalization and was tried on Prozac but stopped it and is now on ativan at bedtime.    Physical Exam:BP 140/82  Pulse 76  Ht 5' 3.5" (1.613 m)  Wt 228 lb 8 oz (103.647 kg)  BMI 39.84 kg/m2  LMP 10/26/2013 General:  Well developed, well nourished, no acute distress Skin:  Warm and dry Neck:  Midline trachea, normal thyroid Lungs; Clear to auscultation bilaterally Breast:  No dominant palpable mass, retraction, or nipple discharge Cardiovascular: Regular rate and rhythm Abdomen:  Soft, non tender, no hepatosplenomegaly Pelvic:  External genitalia is normal in appearance.  The vagina is normal in appearance.  The cervix is bulbous.  Uterus is felt to be normal size, shape, and contour.  No adnexal masses or tenderness noted. Rectal: Good sphincter tone, no polyps, or hemorrhoids felt.  Hemoccult negative. Extremities:  Has swelling and discoloration right lower leg, has compression hose but did not wear today. Psych:  Alert and cooperative, seems happy Discussed menopause and its symptoms, will try SSRI for moods and hot flashes,   Impression: Yearly gyn exam no pap Menopause    Plan: Physical in 1  year Mammogram yearly Labs with PCP, now seeing Dr Sudie BaileyKnowlton Colonoscopy advised Rx lexapro 10 mg 1 daily #30 with 11 refills, can call let men know how it is working but give it 3 months, before makes decision either way Review handout on menopause

## 2013-12-24 NOTE — Patient Instructions (Signed)
Physical in  1 year Menopause Menopause is the normal time of life when menstrual periods stop completely. Menopause is complete when you have missed 12 consecutive menstrual periods. It usually occurs between the ages of 48 years and 55 years. Very rarely does a woman develop menopause before the age of 40 years. At menopause, your ovaries stop producing the female hormones estrogen and progesterone. This can cause undesirable symptoms and also affect your health. Sometimes the symptoms may occur 4 5 years before the menopause begins. There is no relationship between menopause and:  Oral contraceptives.  Number of children you had.  Race.  The age your menstrual periods started (menarche). Heavy smokers and very thin women may develop menopause earlier in life. CAUSES  The ovaries stop producing the female hormones estrogen and progesterone.  Other causes include:  Surgery to remove both ovaries.  The ovaries stop functioning for no known reason.  Tumors of the pituitary gland in the brain.  Medical disease that affects the ovaries and hormone production.  Radiation treatment to the abdomen or pelvis.  Chemotherapy that affects the ovaries. SYMPTOMS   Hot flashes.  Night sweats.  Decrease in sex drive.  Vaginal dryness and thinning of the vagina causing painful intercourse.  Dryness of the skin and developing wrinkles.  Headaches.  Tiredness.  Irritability.  Memory problems.  Weight gain.  Bladder infections.  Hair growth of the face and chest.  Infertility. More serious symptoms include:  Loss of bone (osteoporosis) causing breaks (fractures).  Depression.  Hardening and narrowing of the arteries (atherosclerosis) causing heart attacks and strokes. DIAGNOSIS   When the menstrual periods have stopped for 12 straight months.  Physical exam.  Hormone studies of the blood. TREATMENT  There are many treatment choices and nearly as many questions  about them. The decisions to treat or not to treat menopausal changes is an individual choice made with your health care provider. Your health care provider can discuss the treatments with you. Together, you can decide which treatment will work best for you. Your treatment choices may include:   Hormone therapy (estrogen and progesterone).  Non-hormonal medicines.  Treating the individual symptoms with medicine (for example antidepressants for depression).  Herbal medicines that may help specific symptoms.  Counseling by a psychiatrist or psychologist.  Group therapy.  Lifestyle changes including:  Eating healthy.  Regular exercise.  Limiting caffeine and alcohol.  Stress management and meditation.  No treatment. HOME CARE INSTRUCTIONS   Take the medicine your health care provider gives you as directed.  Get plenty of sleep and rest.  Exercise regularly.  Eat a diet that contains calcium (good for the bones) and soy products (acts like estrogen hormone).  Avoid alcoholic beverages.  Do not smoke.  If you have hot flashes, dress in layers.  Take supplements, calcium, and vitamin D to strengthen bones.  You can use over-the-counter lubricants or moisturizers for vaginal dryness.  Group therapy is sometimes very helpful.  Acupuncture may be helpful in some cases. SEEK MEDICAL CARE IF:   You are not sure you are in menopause.  You are having menopausal symptoms and need advice and treatment.  You are still having menstrual periods after age 51 years.  You have pain with intercourse.  Menopause is complete (no menstrual period for 12 months) and you develop vaginal bleeding.  You need a referral to a specialist (gynecologist, psychiatrist, or psychologist) for treatment. SEEK IMMEDIATE MEDICAL CARE IF:   You have severe depression.  You have excessive vaginal bleeding.  You fell and think you have a broken bone.  You have pain when you urinate.  You  develop leg or chest pain.  You have a fast pounding heart beat (palpitations).  You have severe headaches.  You develop vision problems.  You feel a lump in your breast.  You have abdominal pain or severe indigestion. Document Released: 10/20/2003 Document Revised: 04/01/2013 Document Reviewed: 02/26/2013 [facility] Patient Information 2014 [place].  Mammogram yearly Colonoscopy advised  Labs with PCP

## 2014-06-14 ENCOUNTER — Encounter: Payer: Self-pay | Admitting: Adult Health

## 2014-10-18 ENCOUNTER — Other Ambulatory Visit: Payer: Self-pay | Admitting: Obstetrics and Gynecology

## 2014-10-18 DIAGNOSIS — Z1231 Encounter for screening mammogram for malignant neoplasm of breast: Secondary | ICD-10-CM

## 2014-11-01 ENCOUNTER — Ambulatory Visit (HOSPITAL_COMMUNITY): Payer: Self-pay

## 2014-11-05 ENCOUNTER — Ambulatory Visit (HOSPITAL_COMMUNITY)
Admission: RE | Admit: 2014-11-05 | Discharge: 2014-11-05 | Disposition: A | Discharge status: Home / Self Care | Payer: BLUE CROSS/BLUE SHIELD | Source: Ambulatory Visit | Attending: Obstetrics and Gynecology | Admitting: Obstetrics and Gynecology

## 2014-11-05 DIAGNOSIS — Z1231 Encounter for screening mammogram for malignant neoplasm of breast: Secondary | ICD-10-CM | POA: Diagnosis present

## 2014-12-28 ENCOUNTER — Ambulatory Visit (INDEPENDENT_AMBULATORY_CARE_PROVIDER_SITE_OTHER): Payer: BLUE CROSS/BLUE SHIELD | Admitting: Adult Health

## 2014-12-28 ENCOUNTER — Encounter: Payer: Self-pay | Admitting: Adult Health

## 2014-12-28 VITALS — BP 130/80 | HR 64 | Ht 63.5 in | Wt 246.0 lb

## 2014-12-28 DIAGNOSIS — N950 Postmenopausal bleeding: Secondary | ICD-10-CM

## 2014-12-28 DIAGNOSIS — Z01419 Encounter for gynecological examination (general) (routine) without abnormal findings: Secondary | ICD-10-CM

## 2014-12-28 DIAGNOSIS — Z1212 Encounter for screening for malignant neoplasm of rectum: Secondary | ICD-10-CM

## 2014-12-28 DIAGNOSIS — R5383 Other fatigue: Secondary | ICD-10-CM | POA: Insufficient documentation

## 2014-12-28 DIAGNOSIS — Z139 Encounter for screening, unspecified: Secondary | ICD-10-CM

## 2014-12-28 HISTORY — DX: Other fatigue: R53.83

## 2014-12-28 HISTORY — DX: Postmenopausal bleeding: N95.0

## 2014-12-28 LAB — HEMOCCULT GUIAC POC 1CARD (OFFICE): FECAL OCCULT BLD: NEGATIVE

## 2014-12-28 NOTE — Progress Notes (Signed)
Patient ID: Allison Goodman, female   DOB: January 25, 1961, 54 y.o.   MRN: [id number] History of Present Illness: Allison Goodman is a 54 year old white female, divorced in for a well woman gyn exam.She had a normal pap with negative HPV 12/22/12.She had cellulitis in right leg and saw Dr Sudie BaileyKnowlton and was treated with antibiotics.She has had labs and they were normal  But she is complaining of fatigue and has not had a period since March 10th 2015 and had one March 14th of this year, has occasional hot flash.   Current Medications, Allergies, Past Medical History, Past Surgical History, Family History and Social History were reviewed in [facility] electronic medical record.     Review of Systems: Patient denies any headaches, hearing loss, fatigue, blurred vision, shortness of breath, chest pain, abdominal pain, problems with bowel movements, urination, or intercourse(not having sex). No joint pain or mood swings.See HPI for positives.    Physical Exam:BP 130/80 mmHg  Pulse 64  Ht 5' 3.5" (1.613 m)  Wt 246 lb (111.585 kg)  BMI 42.89 kg/m2  LMP 10/26/2013 General:  Well developed, well nourished, no acute distress Skin:  Warm and dry Neck:  Midline trachea, normal thyroid, good ROM, no lymphadenopathy Lungs; Clear to auscultation bilaterally Breast:  No dominant palpable mass, retraction, or nipple discharge Cardiovascular: Regular rate and rhythm Abdomen:  Soft, non tender, no hepatosplenomegaly Pelvic:  External genitalia is normal in appearance, no lesions.  The vagina is normal in appearance. Urethra has no lesions or masses. The cervix is bulbous.  Uterus is felt to be normal size, shape, and contour.  No adnexal masses or tenderness noted.Bladder is non tender, no masses felt. Rectal: Good sphincter tone, no polyps, or hemorrhoids felt.  Hemoccult negative. Extremities/musculoskeletal:  No  varicosities noted, has some swelling and redness right lower leg, no clubbing or cyanosis Psych:  No  mood changes, alert and cooperative,seems happy   Impression: Well woman gyn exam no pap PMB Fatigue     [facility]: [facility]  Pap and physical in 1 year Mammogram yearly Referred to Dr Jena Gaussourk for screening colonoscopy Will talk when tests back

## 2014-12-28 NOTE — Patient Instructions (Signed)
Pap and physical in 1 year Mammogram yearly Ref[facility] for colon[facility] at 7:30 am with full bladder be there 10 minutes early will talk when results back

## 2014-12-29 ENCOUNTER — Telehealth: Payer: Self-pay | Admitting: Adult Health

## 2014-12-29 LAB — FOLLICLE STIMULATING HORMONE: FSH: 19.6 m[IU]/mL

## 2014-12-29 NOTE — Telephone Encounter (Signed)
Pt aware FSH 19.6, get US as scheduled

## 2015-01-04 ENCOUNTER — Telehealth: Payer: Self-pay | Admitting: Adult Health

## 2015-01-04 NOTE — Telephone Encounter (Signed)
She is bleeding again, like a period and her FSH was not showing she was PM,  She cancelled her US appt for tomorrow due to costs, will keep me posted on bleeding.

## 2015-01-05 ENCOUNTER — Other Ambulatory Visit (HOSPITAL_COMMUNITY): Payer: BLUE CROSS/BLUE SHIELD

## 2015-01-05 ENCOUNTER — Ambulatory Visit (HOSPITAL_COMMUNITY): Admission: RE | Admit: 2015-01-05 | Payer: BLUE CROSS/BLUE SHIELD | Source: Ambulatory Visit

## 2015-01-20 ENCOUNTER — Telehealth: Payer: Self-pay

## 2015-01-21 NOTE — Telephone Encounter (Signed)
Gastroenterology Pre-Procedure Review  Request Date: 01/20/2015 Requesting Physician: Cyril Mourning, NP  PATIENT REVIEW QUESTIONS: The patient responded to the following health history questions as indicated:    1. Diabetes Melitis: no 2. Joint replacements in the past 12 months: no 3. Major health problems in the past 3 months: no 4. Has an artificial valve or MVP: no 5. Has a defibrillator: no 6. Has been advised in past to take antibiotics in advance of a procedure like teeth cleaning: no    MEDICATIONS & ALLERGIES:    Patient reports the following regarding taking any blood thinners:   Plavix? no Aspirin? YES Coumadin? no  Patient confirms/reports the following medications:  Current Outpatient Prescriptions  Medication Sig Dispense Refill  . albuterol (PROVENTIL HFA;VENTOLIN HFA) 108 (90 BASE) MCG/ACT inhaler Inhale into the lungs as needed for wheezing or shortness of breath.    Marland Kitchen aspirin 81 MG tablet Take 81 mg by mouth daily.    . cetirizine (ZYRTEC) 10 MG tablet Take 10 mg by mouth as needed.     Marland Kitchen escitalopram (LEXAPRO) 20 MG tablet Take 20 mg by mouth daily.    Marland Kitchen HYDROcodone-acetaminophen (NORCO) 7.5-325 MG per tablet Take 1 tablet by mouth every 6 (six) hours as needed for pain. PT said she averages about 21 tablets weekly    . LORazepam (ATIVAN) 1 MG tablet Take 1 mg by mouth. One half to 1 tab at bedtime/ pt said  she does not take on a regular basis    . diphenhydrAMINE (BENADRYL) 25 MG tablet Take 25 mg by mouth as needed.      No current facility-administered medications for this visit.    Patient confirms/reports the following allergies:  Allergies  Allergen Reactions  . Morphine And Related Nausea And Vomiting    No orders of the defined types were placed in this encounter.    AUTHORIZATION INFORMATION Primary Insurance:   ID #:  Group #:  Pre-Cert / Auth required: Pre-Cert / Auth #:   Secondary Insurance:   ID #:   Group #:  Pre-Cert / Auth  required:  Pre-Cert / Auth #:   SCHEDULE INFORMATION: Procedure has been scheduled as follows:  Date:               Time:   Location:   This Gastroenterology Pre-Precedure Review Form is being routed to the following provider(s): R. Roetta Sessions, MD

## 2015-01-21 NOTE — Telephone Encounter (Signed)
I called pt and LMOM that she would need an OV and gave her an appt for 02/07/2015 at 9:30 AM with Wynne Dust, NP.  I asked for a return call.

## 2015-01-21 NOTE — Telephone Encounter (Signed)
Needs OV. May need Propofol.

## 2015-01-24 NOTE — Telephone Encounter (Signed)
Tried to call pt again. LMOM her OV appt time.  Also mailed a letter with appt date and time.

## 2015-01-25 NOTE — Telephone Encounter (Signed)
Pt called and rescheduled her ov appt to 02/22/2015 at 10:30 AM with Wynne Dust, NP.

## 2015-02-07 ENCOUNTER — Ambulatory Visit: Payer: BLUE CROSS/BLUE SHIELD | Admitting: Nurse Practitioner

## 2015-02-21 ENCOUNTER — Ambulatory Visit (HOSPITAL_COMMUNITY)
Admission: RE | Admit: 2015-02-21 | Discharge: 2015-02-21 | Disposition: A | Discharge status: Home / Self Care | Payer: BLUE CROSS/BLUE SHIELD | Source: Ambulatory Visit | Attending: Family Medicine | Admitting: Family Medicine

## 2015-02-21 ENCOUNTER — Other Ambulatory Visit (HOSPITAL_COMMUNITY): Payer: Self-pay | Admitting: Family Medicine

## 2015-02-21 DIAGNOSIS — Z8672 Personal history of thrombophlebitis: Secondary | ICD-10-CM | POA: Insufficient documentation

## 2015-02-21 DIAGNOSIS — R609 Edema, unspecified: Secondary | ICD-10-CM | POA: Diagnosis not present

## 2015-02-21 DIAGNOSIS — M8430XA Stress fracture, unspecified site, initial encounter for fracture: Secondary | ICD-10-CM

## 2015-02-21 DIAGNOSIS — M79605 Pain in left leg: Secondary | ICD-10-CM | POA: Diagnosis present

## 2015-02-22 ENCOUNTER — Ambulatory Visit: Payer: BLUE CROSS/BLUE SHIELD | Admitting: Nurse Practitioner

## 2015-11-10 ENCOUNTER — Other Ambulatory Visit: Payer: Self-pay | Admitting: Obstetrics and Gynecology

## 2015-11-10 DIAGNOSIS — Z1231 Encounter for screening mammogram for malignant neoplasm of breast: Secondary | ICD-10-CM

## 2015-11-21 ENCOUNTER — Ambulatory Visit (HOSPITAL_COMMUNITY)
Admission: RE | Admit: 2015-11-21 | Discharge: 2015-11-21 | Disposition: A | Discharge status: Home / Self Care | Payer: BLUE CROSS/BLUE SHIELD | Source: Ambulatory Visit | Attending: Obstetrics and Gynecology | Admitting: Obstetrics and Gynecology

## 2015-11-21 DIAGNOSIS — Z1231 Encounter for screening mammogram for malignant neoplasm of breast: Secondary | ICD-10-CM | POA: Diagnosis present

## 2015-12-30 ENCOUNTER — Ambulatory Visit (INDEPENDENT_AMBULATORY_CARE_PROVIDER_SITE_OTHER): Payer: BLUE CROSS/BLUE SHIELD | Admitting: Adult Health

## 2015-12-30 ENCOUNTER — Encounter: Payer: Self-pay | Admitting: Adult Health

## 2015-12-30 ENCOUNTER — Other Ambulatory Visit (HOSPITAL_COMMUNITY)
Admission: RE | Admit: 2015-12-30 | Discharge: 2015-12-30 | Disposition: A | Discharge status: Home / Self Care | Payer: BLUE CROSS/BLUE SHIELD | Source: Ambulatory Visit | Attending: Adult Health | Admitting: Adult Health

## 2015-12-30 VITALS — BP 144/90 | HR 72 | Ht 63.0 in | Wt 245.0 lb

## 2015-12-30 DIAGNOSIS — Z01411 Encounter for gynecological examination (general) (routine) with abnormal findings: Secondary | ICD-10-CM

## 2015-12-30 DIAGNOSIS — Z01419 Encounter for gynecological examination (general) (routine) without abnormal findings: Secondary | ICD-10-CM | POA: Insufficient documentation

## 2015-12-30 DIAGNOSIS — Z1211 Encounter for screening for malignant neoplasm of colon: Secondary | ICD-10-CM | POA: Diagnosis not present

## 2015-12-30 DIAGNOSIS — I10 Essential (primary) hypertension: Secondary | ICD-10-CM | POA: Diagnosis not present

## 2015-12-30 DIAGNOSIS — F329 Major depressive disorder, single episode, unspecified: Secondary | ICD-10-CM | POA: Diagnosis not present

## 2015-12-30 DIAGNOSIS — Z1151 Encounter for screening for human papillomavirus (HPV): Secondary | ICD-10-CM | POA: Diagnosis present

## 2015-12-30 DIAGNOSIS — N926 Irregular menstruation, unspecified: Secondary | ICD-10-CM | POA: Diagnosis not present

## 2015-12-30 DIAGNOSIS — F32A Depression, unspecified: Secondary | ICD-10-CM

## 2015-12-30 HISTORY — DX: Depression, unspecified: F32.A

## 2015-12-30 HISTORY — DX: Irregular menstruation, unspecified: N92.6

## 2015-12-30 LAB — HEMOCCULT GUIAC POC 1CARD (OFFICE): FECAL OCCULT BLD: NEGATIVE

## 2015-12-30 MED ORDER — HYDROCHLOROTHIAZIDE 12.5 MG PO CAPS: 12.5000 mg | ORAL_CAPSULE | Freq: Every day | ORAL | Status: DC

## 2015-12-30 MED ORDER — BUPROPION HCL ER (SR) 150 MG PO TB12: 150.0000 mg | ORAL_TABLET | Freq: Every day | ORAL | Status: DC

## 2015-12-30 NOTE — Progress Notes (Signed)
Patient ID: Allison Goodman, female   DOB: 05-13-1961, 55 y.o.   MRN: [id number] History of Present Illness: Allison Goodman is a 55 year old white female, in for a well woman gyn exam and pap.She is having stress at home and work and feels a little depressed, she used to take lexapro but ha stopped, it quit working.She has had irregular bleeding since January, she had a period in March 2015, then one in March 2016 [facility], she was supposed to get GYN US but did not. PCP is Dr Sudie BaileyKnowlton, who is treating cellulitis in right lower leg for some time now, she works at [employer].Has son still at home and his girlfriend is pregnant.   Current Medications, Allergies, Past Medical History, Past Surgical History, Family History and Social History were reviewed in [facility] electronic medical record.     Review of Systems: Patient denies any headaches, hearing loss, fatigue, blurred vision, shortness of breath, chest pain, abdominal pain, problems with bowel movements, urination, or intercourse(not having sex). No joint pain or mood swings.See HPI for positives    Physical Exam:BP 144/90 mmHg  Pulse 72  Ht 5\' 3"  (1.6 m)  Wt 245 lb (111.131 kg)  BMI 43.41 kg/m2  LMP 12/30/2015  PHQ 9 score 13. General:  Well developed, well nourished, no acute distress Skin:  Warm and dry Neck:  Midline trachea, normal thyroid, good ROM, no lymphadenopathy Lungs; Clear to auscultation bilaterally Breast:  No dominant palpable mass, retraction, or nipple discharge Cardiovascular: Regular rate and rhythm Abdomen:  Soft, non tender, no hepatosplenomegaly Pelvic:  External genitalia is normal in appearance, no lesions.  The vagina is normal in appearance. Urethra has no lesions or masses. The cervix is bulbous, Pap with HPV performed.  Uterus is felt to be normal size, shape, and contour.  No adnexal masses or tenderness noted.Bladder is non tender, no masses felt. Rectal: Good sphincter tone, no polyps,  or hemorrhoids felt.  Hemoccult negative. Extremities/musculoskeletal:  no clubbing or cyanosis, has some swelling and discoloration in right lower leg and small ulcer Psych:  No mood changes, alert and cooperative,seems happy,but stressed   Impression: Well woman gyn exam and pap Depression Hypertension Irregular bleeding    Plan: Check CBC,CMP,TSH and lipids.A1c and vitamin D and FSH Rx microzide 12.5 mg #30 take 1 daily with 11 refills Rx wellbutrin SR 150 mg #30 take 1 daily with 3 refills Mammogram yearly Physical in 1 year, pap in 3 if normal Return 6/5 for GYN US to assess uterus and lining(this is her day off) Colonoscopy advised Have nurse check BP at work

## 2015-12-30 NOTE — Patient Instructions (Signed)
Physical in 1 year Pap in 3 if normal Mammogram yearly Return in 2 weeks for US Check BP at work

## 2015-12-31 LAB — COMPREHENSIVE METABOLIC PANEL
A/G RATIO: 1.5 (ref 1.2–2.2)
ALBUMIN: 4 g/dL (ref 3.5–5.5)
ALT: 13 IU/L (ref 0–32)
AST: 16 IU/L (ref 0–40)
Alkaline Phosphatase: 90 IU/L (ref 39–117)
BUN/Creatinine Ratio: 23 (ref 9–23)
BUN: 14 mg/dL (ref 6–24)
Bilirubin Total: 0.4 mg/dL (ref 0.0–1.2)
CALCIUM: 8.9 mg/dL (ref 8.7–10.2)
CO2: 27 mmol/L (ref 18–29)
Chloride: 100 mmol/L (ref 96–106)
Creatinine, Ser: 0.6 mg/dL (ref 0.57–1.00)
GFR, EST AFRICAN AMERICAN: 121 mL/min/{1.73_m2} (ref >59)
GFR, EST NON AFRICAN AMERICAN: 105 mL/min/{1.73_m2} (ref >59)
GLOBULIN, TOTAL: 2.7 g/dL (ref 1.5–4.5)
Glucose: 104 mg/dL — ABNORMAL HIGH (ref 65–99)
POTASSIUM: 4.3 mmol/L (ref 3.5–5.2)
SODIUM: 140 mmol/L (ref 134–144)
TOTAL PROTEIN: 6.7 g/dL (ref 6.0–8.5)

## 2015-12-31 LAB — CBC
HEMATOCRIT: 38.5 % (ref 34.0–46.6)
Hemoglobin: 12.9 g/dL (ref 11.1–15.9)
MCH: 28.6 pg (ref 26.6–33.0)
MCHC: 33.5 g/dL (ref 31.5–35.7)
MCV: 85 fL (ref 79–97)
Platelets: 282 10*3/uL (ref 150–379)
RBC: 4.51 x10E6/uL (ref 3.77–5.28)
RDW: 13.6 % (ref 12.3–15.4)
WBC: 4.9 10*3/uL (ref 3.4–10.8)

## 2015-12-31 LAB — LIPID PANEL
CHOL/HDL RATIO: 2.6 ratio (ref 0.0–4.4)
Cholesterol, Total: 204 mg/dL — ABNORMAL HIGH (ref 100–199)
HDL: 78 mg/dL (ref >39)
LDL CALC: 110 mg/dL — AB (ref 0–99)
Triglycerides: 79 mg/dL (ref 0–149)
VLDL Cholesterol Cal: 16 mg/dL (ref 5–40)

## 2015-12-31 LAB — TSH: TSH: 1.42 u[IU]/mL (ref 0.450–4.500)

## 2015-12-31 LAB — VITAMIN D 25 HYDROXY (VIT D DEFICIENCY, FRACTURES): Vit D, 25-Hydroxy: 9.9 ng/mL — ABNORMAL LOW (ref 30.0–100.0)

## 2015-12-31 LAB — HEMOGLOBIN A1C
ESTIMATED AVERAGE GLUCOSE: 157 mg/dL
HEMOGLOBIN A1C: 7.1 % — AB (ref 4.8–5.6)

## 2015-12-31 LAB — FOLLICLE STIMULATING HORMONE: FSH: 54.8 m[IU]/mL

## 2016-01-02 ENCOUNTER — Telehealth: Payer: Self-pay | Admitting: Adult Health

## 2016-01-02 LAB — CYTOLOGY - PAP

## 2016-01-02 NOTE — Telephone Encounter (Signed)
Left message to call me.

## 2016-01-03 ENCOUNTER — Telehealth: Payer: Self-pay | Admitting: Adult Health

## 2016-01-03 ENCOUNTER — Encounter: Payer: Self-pay | Admitting: Adult Health

## 2016-01-03 DIAGNOSIS — E559 Vitamin D deficiency, unspecified: Secondary | ICD-10-CM | POA: Insufficient documentation

## 2016-01-03 DIAGNOSIS — E119 Type 2 diabetes mellitus without complications: Secondary | ICD-10-CM

## 2016-01-03 HISTORY — DX: Vitamin D deficiency, unspecified: E55.9

## 2016-01-03 HISTORY — DX: Type 2 diabetes mellitus without complications: E11.9

## 2016-01-03 MED ORDER — CHOLECALCIFEROL 125 MCG (5000 UT) PO CAPS: 5000.0000 [IU] | ORAL_CAPSULE | Freq: Every day | ORAL | Status: DC

## 2016-01-03 MED ORDER — METFORMIN HCL 500 MG PO TABS: 500.0000 mg | ORAL_TABLET | Freq: Every day | ORAL | Status: DC

## 2016-01-03 MED ORDER — LISINOPRIL 10 MG PO TABS: 10.0000 mg | ORAL_TABLET | Freq: Every day | ORAL | Status: DC

## 2016-01-03 NOTE — Telephone Encounter (Signed)
Pt is aware that pap was normal and that on labs vitamin D 9.9 which is low, take 5000 IU vitamin D daily and A1c was 7.1, that means she is diabetic wil rx metformin 500 mg 1 daily and will add lisinopril 10 mg 1 daily, BP was 150/90 yesterday and that FSH 54.8 so she is PM, keep appt for US to assess PMB, and follow up with Dr Sudie BaileyKnowlton ,PCP for further treatment, has appt in June, will forward him these labs.

## 2016-01-16 ENCOUNTER — Other Ambulatory Visit: Payer: Self-pay | Admitting: Adult Health

## 2016-01-16 ENCOUNTER — Ambulatory Visit (INDEPENDENT_AMBULATORY_CARE_PROVIDER_SITE_OTHER): Payer: BLUE CROSS/BLUE SHIELD

## 2016-01-16 DIAGNOSIS — N950 Postmenopausal bleeding: Secondary | ICD-10-CM | POA: Diagnosis not present

## 2016-01-16 DIAGNOSIS — N926 Irregular menstruation, unspecified: Secondary | ICD-10-CM | POA: Diagnosis not present

## 2016-01-16 NOTE — Progress Notes (Signed)
PELVIC US TA/TV: homogenous anteverted uterus,normal ov's bilat(mobile),EEC 8 mm,mult. small nabothian cysts,no free fluid,no pain during ultrasound

## 2016-01-18 ENCOUNTER — Telehealth: Payer: Self-pay | Admitting: Adult Health

## 2016-01-18 NOTE — Telephone Encounter (Signed)
Pt aware US showed thickened endometrium 8 mm and FSH 54.8 will get endo biopsy with Dr Despina HiddenEure

## 2016-01-18 NOTE — Telephone Encounter (Signed)
Left message to call me.

## 2016-01-24 ENCOUNTER — Encounter: Payer: Self-pay | Admitting: Obstetrics & Gynecology

## 2016-01-24 ENCOUNTER — Ambulatory Visit (INDEPENDENT_AMBULATORY_CARE_PROVIDER_SITE_OTHER): Payer: BLUE CROSS/BLUE SHIELD | Admitting: Obstetrics & Gynecology

## 2016-01-24 ENCOUNTER — Other Ambulatory Visit: Payer: Self-pay | Admitting: Obstetrics & Gynecology

## 2016-01-24 VITALS — BP 102/80 | HR 78 | Ht 63.0 in | Wt 231.4 lb

## 2016-01-24 DIAGNOSIS — R9389 Abnormal findings on diagnostic imaging of other specified body structures: Secondary | ICD-10-CM

## 2016-01-24 DIAGNOSIS — N950 Postmenopausal bleeding: Secondary | ICD-10-CM | POA: Diagnosis not present

## 2016-01-24 DIAGNOSIS — R938 Abnormal findings on diagnostic imaging of other specified body structures: Secondary | ICD-10-CM | POA: Diagnosis not present

## 2016-01-24 MED ORDER — SILVER SULFADIAZINE 1 % EX CREA: TOPICAL_CREAM | CUTANEOUS | Status: DC

## 2016-01-24 NOTE — Addendum Note (Signed)
Addended by: Federico FlakeNES, Deni Lefever A on: 01/24/2016 04:11 PM   Modules accepted: Orders

## 2016-01-24 NOTE — Addendum Note (Signed)
Addended by: Federico FlakeNES, Bailei Buist A on: 01/24/2016 04:28 PM   Modules accepted: Orders

## 2016-01-24 NOTE — Progress Notes (Signed)
Patient ID: Allison ButteVickie R Riege, female   DOB: 1960-09-28, 55 y.o.   MRN: [id number] Endometrial Biopsy Procedure Note  Pre-operative Diagnosis: postmenopausal bleeding with thickened endometrium  Post-operative Diagnosis: same  Indications: postmenopausal bleeding  Procedure Details   Urine pregnancy test was not done.  The risks (including infection, bleeding, pain, and uterine perforation) and benefits of the procedure were explained to the patient and Written informed consent was obtained.  Antibiotic prophylaxis against endocarditis was not indicated.   The patient was placed in the dorsal lithotomy position.  Bimanual exam showed the uterus to be in the neutral position.  A Graves' speculum inserted in the vagina, and the cervix prepped with povidone iodine.  Endocervical curettage with a Kevorkian curette was not performed.   A sharp tenaculum was applied to the anterior lip of the cervix for stabilization.  A sterile uterine sound was used to sound the uterus to a depth of 5.5cm.  A Pipelle endometrial aspirator was used to sample the endometrium.  Sample was sent for pathologic examination.  Condition: Stable  Complications: None  Plan:  The patient was advised to call for any fever or for prolonged or severe pain or bleeding. She was advised to use OTC analgesics as needed for mild to moderate pain. She was advised to avoid vaginal intercourse for 48 hours or until the bleeding has completely stopped.  Attending Physician Documentation: I was present for or performed the following: endometrial biopsy  Follow up results 1 week

## 2016-01-31 ENCOUNTER — Ambulatory Visit (INDEPENDENT_AMBULATORY_CARE_PROVIDER_SITE_OTHER): Payer: BLUE CROSS/BLUE SHIELD | Admitting: Obstetrics & Gynecology

## 2016-01-31 ENCOUNTER — Encounter: Payer: Self-pay | Admitting: Obstetrics & Gynecology

## 2016-01-31 VITALS — BP 110/60 | HR 60 | Wt 236.0 lb

## 2016-01-31 DIAGNOSIS — N952 Postmenopausal atrophic vaginitis: Secondary | ICD-10-CM

## 2016-01-31 DIAGNOSIS — N950 Postmenopausal bleeding: Secondary | ICD-10-CM | POA: Diagnosis not present

## 2016-01-31 MED ORDER — MEDROXYPROGESTERONE ACETATE 10 MG PO TABS: 10.0000 mg | ORAL_TABLET | Freq: Every day | ORAL | Status: DC

## 2016-01-31 NOTE — Progress Notes (Signed)
Patient ID: Allison Goodman, female   DOB: November 04, 1960, 55 y.o.   MRN: [id number] Follow up appointment for results  Chief Complaint  Patient presents with  . Follow-up    result biopsy    Blood pressure 110/60, pulse 60, weight 236 lb (107.049 kg).  Endometrial biopsy reveals atrophic endometrium without hyperplasia or atypia or malignancy Obviously getting a little ongoing estrogenic stimulation, either from ovaries or from peripheral fat production of estrone  As a result will cycle with provera 10 days every 3 months in order to provide progestational suppression Pt understands she may or may not bleed I recommend this approach for a couple of years and see if she bleeds If she doesn't stop the provera for 6 months and reassess endometrium  MEDS ordered this encounter: No orders of the defined types were placed in this encounter.    Orders for this encounter: No orders of the defined types were placed in this encounter.    Plan: See above Follow Up: 6 months with Victorino DikeJennifer to see if bleeding or having any problems    Face to face time:  10 minutes  Greater than 50% of the visit time was spent in counseling and coordination of care with the patient.  The summary and outline of the counseling and care coordination is summarized in the note above.   All questions were answered.  Past Medical History  Diagnosis Date  . Obesity   . Elevated blood sugar   . Cellulitis 12/22/2012    Rx doxy and refer to Dr. Hilda LiasKeeling may need hard ware removed  . Hypertension   . Seasonal allergies   . Menopause 12/24/2013  . PMB (postmenopausal bleeding) 12/28/2014  . Fatigue 12/28/2014  . Depression 12/30/2015  . Irregular bleeding 12/30/2015  . Vitamin D deficiency 01/03/2016  . Diabetes (HCC) 01/03/2016    Past Surgical History  Procedure Laterality Date  . Ankle surgery Right     screws placed    OB History    Gravida Para Term Preterm AB TAB SAB Ectopic Multiple Living   2 2         2       Allergies  Allergen Reactions  . Morphine And Related Nausea And Vomiting    Social History   Social History  . Marital Status: Divorced    Spouse Name: N/A  . Number of Children: N/A  . Years of Education: N/A   Social History Main Topics  . Smoking status: Never Smoker   . Smokeless tobacco: Never Used  . Alcohol Use: No  . Drug Use: No  . Sexual Activity: Not Currently    Birth Control/ Protection: None   Other Topics Concern  . None   Social History Narrative    Family History  Problem Relation Age of Onset  . COPD Mother   . Diabetes Mother   . Hyperlipidemia Mother   . Hypertension Mother   . Parkinson's disease Father   . Diabetes Brother   . Hyperlipidemia Brother   . Mitral valve prolapse Sister   . Diabetes Brother   . Hyperlipidemia Brother   . Diabetes Sister   . Hyperlipidemia Sister   . Hypertension Sister   . Heart disease Sister     before age 53

## 2016-03-30 ENCOUNTER — Other Ambulatory Visit: Payer: Self-pay | Admitting: Adult Health

## 2016-07-02 ENCOUNTER — Other Ambulatory Visit: Payer: Self-pay | Admitting: Adult Health

## 2016-08-02 ENCOUNTER — Ambulatory Visit (INDEPENDENT_AMBULATORY_CARE_PROVIDER_SITE_OTHER): Payer: BLUE CROSS/BLUE SHIELD | Admitting: Obstetrics & Gynecology

## 2016-08-02 ENCOUNTER — Encounter: Payer: Self-pay | Admitting: Obstetrics & Gynecology

## 2016-08-02 ENCOUNTER — Other Ambulatory Visit: Payer: Self-pay | Admitting: Adult Health

## 2016-08-02 VITALS — BP 130/80 | HR 78 | Wt 221.0 lb

## 2016-08-02 DIAGNOSIS — N924 Excessive bleeding in the premenopausal period: Secondary | ICD-10-CM | POA: Diagnosis not present

## 2016-08-02 MED ORDER — MEDROXYPROGESTERONE ACETATE 10 MG PO TABS: 10.0000 mg | ORAL_TABLET | Freq: Every day | ORAL | 4 refills | Status: DC

## 2016-08-02 NOTE — Progress Notes (Signed)
Patient ID: Allison Goodman, female   DOB: 1961/04/25, 55 y.o.   MRN: [id number]       Chief Complaint  Patient presents with  . Follow-up    Provera    Blood pressure 130/80, pulse 78, weight 221 lb (100.2 kg), last menstrual period 05/24/2016.  55 y.o. G2P2 Patient's last menstrual period was 05/24/2016. The current method of family planning is none.  Outpatient Encounter Prescriptions as of 08/02/2016  Medication Sig  . aspirin 81 MG tablet Take 81 mg by mouth daily.  Marland Kitchen. buPROPion (WELLBUTRIN SR) 150 MG 12 hr tablet TAKE ONE (1) TABLET EACH DAY  . cetirizine (ZYRTEC) 10 MG tablet Take 10 mg by mouth as needed. Reported on 01/31/2016  . Cholecalciferol 5000 units capsule Take 1 capsule (5,000 Units total) by mouth daily.  . hydrochlorothiazide (MICROZIDE) 12.5 MG capsule Take 1 capsule (12.5 mg total) by mouth daily.  Marland Kitchen. HYDROcodone-acetaminophen (NORCO) 7.5-325 MG per tablet Take 1 tablet by mouth every 6 (six) hours as needed for pain. PT said she averages about 21 tablets weekly  . lisinopril (PRINIVIL,ZESTRIL) 10 MG tablet TAKE ONE (1) TABLET EACH DAY  . medroxyPROGESTERone (PROVERA) 10 MG tablet Take 1 tablet (10 mg total) by mouth daily.  . metFORMIN (GLUCOPHAGE) 500 MG tablet TAKE 1 TABLET EVERY MORNING WITH BREAKFAST  . [DISCONTINUED] medroxyPROGESTERone (PROVERA) 10 MG tablet Take 1 tablet (10 mg total) by mouth daily.  . [DISCONTINUED] silver sulfADIAZINE (SILVADENE) 1 % cream Use to area 3 times daily   No facility-administered encounter medications on file as of 08/02/2016.     Subjective See note below Pt has on going estrogenic stimulation of the endometrium from residual ovarian function or peripheral fat production Placed on cyclical provera 10 mg x 10 days every 3 months  So far has done 2 cycles with 8 days of bleeding each time and like a regular period  Will need to continue until at least no cyclical bleeding for 1 year then  reassess  Objective   Pertinent ROS   Labs or studies     Impression Diagnoses this Encounter::   ICD-9-CM ICD-10-CM   1. Abnormal perimenopausal bleeding 627.0 N92.4     Established relevant diagnosis(es):   Plan/Recommendations: Meds ordered this encounter  Medications  . medroxyPROGESTERone (PROVERA) 10 MG tablet    Sig: Take 1 tablet (10 mg total) by mouth daily.    Dispense:  30 tablet    Refill:  4    Labs or Scans Ordered: No orders of the defined types were placed in this encounter.   Management:: Cyclical provera every 3 months until at least 1 year of amenorrhea  Follow up Return in about 1 year (around 08/02/2017) for yearly.        Face to face time:  15 minutes  Greater than 50% of the visit time was spent in counseling and coordination of care with the patient.  The summary and outline of the counseling and care coordination is summarized in the note above.   All questions were answered.  Past Medical History:  Diagnosis Date  . Cellulitis 12/22/2012   Rx doxy and refer to Dr. Hilda LiasKeeling may need hard ware removed  . Depression 12/30/2015  . Diabetes (HCC) 01/03/2016  . Elevated blood sugar   . Fatigue 12/28/2014  . Hypertension   . Irregular bleeding 12/30/2015  . Menopause 12/24/2013  . Obesity   . PMB (postmenopausal bleeding) 12/28/2014  . Seasonal allergies   . Vitamin  D deficiency 01/03/2016    Past Surgical History:  Procedure Laterality Date  . ANKLE SURGERY Right    screws placed    OB History    Gravida Para Term Preterm AB Living   2 2       2    SAB TAB Ectopic Multiple Live Births           2      Allergies  Allergen Reactions  . Morphine And Related Nausea And Vomiting    Social History   Social History  . Marital status: Divorced    Spouse name: N/A  . Number of children: N/A  . Years of education: N/A   Social History Main Topics  . Smoking status: Never Smoker  . Smokeless tobacco: Never Used  .  Alcohol use No  . Drug use: No  . Sexual activity: Not Currently    Birth control/ protection: None   Other Topics Concern  . None   Social History Narrative  . None    Family History  Problem Relation Age of Onset  . COPD Mother   . Diabetes Mother   . Hyperlipidemia Mother   . Hypertension Mother   . Parkinson's disease Father   . Diabetes Brother   . Hyperlipidemia Brother   . Mitral valve prolapse Sister   . Diabetes Brother   . Hyperlipidemia Brother   . Diabetes Sister   . Hyperlipidemia Sister   . Hypertension Sister   . Heart disease Sister     before age 53       Follow up appointment for results  Chief Complaint  Patient presents with  . Follow-up    Provera    Blood pressure 130/80, pulse 78, weight 221 lb (100.2 kg), last menstrual period 05/24/2016.  Endometrial biopsy reveals atrophic endometrium without hyperplasia or atypia or malignancy Obviously getting a little ongoing estrogenic stimulation, either from ovaries or from peripheral fat production of estrone  As a result will cycle with provera 10 days every 3 months in order to provide progestational suppression Pt understands she may or may not bleed I recommend this approach for a couple of years and see if she bleeds If she doesn't stop the provera for 6 months and reassess endometrium  MEDS ordered this encounter: Meds ordered this encounter  Medications  . medroxyPROGESTERone (PROVERA) 10 MG tablet    Sig: Take 1 tablet (10 mg total) by mouth daily.    Dispense:  30 tablet    Refill:  4    Orders for this encounter: No orders of the defined types were placed in this encounter.   Plan: See above Follow Up: 6 months with Victorino DikeJennifer to see if bleeding or having any problems    Face to face time:  10 minutes  Greater than 50% of the visit time was spent in counseling and coordination of care with the patient.  The summary and outline of the counseling and care coordination is  summarized in the note above.   All questions were answered.  Past Medical History:  Diagnosis Date  . Cellulitis 12/22/2012   Rx doxy and refer to Dr. Hilda LiasKeeling may need hard ware removed  . Depression 12/30/2015  . Diabetes (HCC) 01/03/2016  . Elevated blood sugar   . Fatigue 12/28/2014  . Hypertension   . Irregular bleeding 12/30/2015  . Menopause 12/24/2013  . Obesity   . PMB (postmenopausal bleeding) 12/28/2014  . Seasonal allergies   . Vitamin D deficiency  01/03/2016    Past Surgical History:  Procedure Laterality Date  . ANKLE SURGERY Right    screws placed    OB History    Gravida Para Term Preterm AB Living   2 2       2    SAB TAB Ectopic Multiple Live Births           2      Allergies  Allergen Reactions  . Morphine And Related Nausea And Vomiting    Social History   Social History  . Marital status: Divorced    Spouse name: N/A  . Number of children: N/A  . Years of education: N/A   Social History Main Topics  . Smoking status: Never Smoker  . Smokeless tobacco: Never Used  . Alcohol use No  . Drug use: No  . Sexual activity: Not Currently    Birth control/ protection: None   Other Topics Concern  . None   Social History Narrative  . None    Family History  Problem Relation Age of Onset  . COPD Mother   . Diabetes Mother   . Hyperlipidemia Mother   . Hypertension Mother   . Parkinson's disease Father   . Diabetes Brother   . Hyperlipidemia Brother   . Mitral valve prolapse Sister   . Diabetes Brother   . Hyperlipidemia Brother   . Diabetes Sister   . Hyperlipidemia Sister   . Hypertension Sister   . Heart disease Sister     before age 85

## 2016-11-14 ENCOUNTER — Telehealth: Payer: Self-pay | Admitting: *Deleted

## 2016-11-14 NOTE — Telephone Encounter (Signed)
Spoke with pt. Pt had labs done 2 weeks ago. Her vit D is 37, last year it was 9.9. She takes Vit D 5000 units daily. Her A1C was 7.1 last year and this year it's 6.2. She is taking Metformin 500 mg. I spoke with JAG and she advised to continue on same dose of both meds and keep up the good work!! Pt voiced understanding. JSY

## 2016-11-29 ENCOUNTER — Other Ambulatory Visit: Payer: Self-pay | Admitting: Adult Health

## 2016-12-28 ENCOUNTER — Other Ambulatory Visit: Payer: Self-pay | Admitting: Women's Health

## 2016-12-31 ENCOUNTER — Other Ambulatory Visit: Payer: Self-pay | Admitting: Adult Health

## 2017-01-15 ENCOUNTER — Other Ambulatory Visit: Payer: Self-pay | Admitting: Adult Health

## 2017-01-15 DIAGNOSIS — Z1231 Encounter for screening mammogram for malignant neoplasm of breast: Secondary | ICD-10-CM

## 2017-01-17 ENCOUNTER — Ambulatory Visit (HOSPITAL_COMMUNITY)
Admission: RE | Admit: 2017-01-17 | Discharge: 2017-01-17 | Disposition: A | Discharge status: Home / Self Care | Payer: BLUE CROSS/BLUE SHIELD | Source: Ambulatory Visit | Attending: Adult Health | Admitting: Adult Health

## 2017-01-17 ENCOUNTER — Encounter: Payer: Self-pay | Admitting: Adult Health

## 2017-01-17 ENCOUNTER — Ambulatory Visit (INDEPENDENT_AMBULATORY_CARE_PROVIDER_SITE_OTHER): Payer: BLUE CROSS/BLUE SHIELD | Admitting: Adult Health

## 2017-01-17 VITALS — BP 112/58 | HR 64 | Ht 63.0 in | Wt 220.5 lb

## 2017-01-17 DIAGNOSIS — F329 Major depressive disorder, single episode, unspecified: Secondary | ICD-10-CM

## 2017-01-17 DIAGNOSIS — Z1211 Encounter for screening for malignant neoplasm of colon: Secondary | ICD-10-CM

## 2017-01-17 DIAGNOSIS — Z01419 Encounter for gynecological examination (general) (routine) without abnormal findings: Secondary | ICD-10-CM

## 2017-01-17 DIAGNOSIS — I10 Essential (primary) hypertension: Secondary | ICD-10-CM

## 2017-01-17 DIAGNOSIS — Z1231 Encounter for screening mammogram for malignant neoplasm of breast: Secondary | ICD-10-CM | POA: Diagnosis not present

## 2017-01-17 DIAGNOSIS — Z1212 Encounter for screening for malignant neoplasm of rectum: Secondary | ICD-10-CM | POA: Diagnosis not present

## 2017-01-17 DIAGNOSIS — F32A Depression, unspecified: Secondary | ICD-10-CM

## 2017-01-17 LAB — HEMOCCULT GUIAC POC 1CARD (OFFICE): Fecal Occult Blood, POC: NEGATIVE

## 2017-01-17 MED ORDER — BUSPIRONE HCL 5 MG PO TABS: 5.0000 mg | ORAL_TABLET | Freq: Three times a day (TID) | ORAL | 3 refills | Status: DC

## 2017-01-17 NOTE — Progress Notes (Signed)
Patient ID: Allison Goodman Allison Goodman, female   DOB: 15-Sep-1960, 56 y.o.   MRN: [id number] History of Present Illness:  Allison Goodman is a 56 year old white female, divorced in for well woman gyn exam, she had normal pap with negative HPV 12/30/15. She is complaining of being moody and teary at times.She is still using provera every 3 months and still has some bleeding but is shorter and lighter.She has 2 sons living with her and she is the only one working and hours not as good.  PCP is Allison Goodman.  Current Medications, Allergies, Past Medical History, Past Surgical History, Family History and Social History were reviewed in [facility] electronic medical record.     Review of Systems: Patient denies any headaches, hearing loss, fatigue, blurred vision, shortness of breath, chest pain, abdominal pain, problems with bowel movements, urination, or intercourse. No joint pain or mood swings.See HPI for positives.    Physical Exam:BP (!) 112/58 (BP Location: Left Arm, Patient Position: Sitting, Cuff Size: Large)   Pulse 64   Ht 5\' 3"  (1.6 m)   Wt 220 lb 8 oz (100 kg)   LMP 11/22/2016 Comment: taking Provera  BMI 39.06 kg/m  General:  Well developed, well nourished, no acute distress Skin:  Warm and dry Neck:  Midline trachea, normal thyroid, good ROM, no lymphadenopathy Lungs; Clear to auscultation bilaterally Breast:  No dominant palpable mass, retraction, or nipple discharge Cardiovascular: Regular rate and rhythm Abdomen:  Soft, non tender, no hepatosplenomegaly Pelvic:  External genitalia is normal in appearance, no lesions.  The vagina is normal in appearance. Urethra has no lesions or masses. The cervix is bulbous.  Uterus is felt to be normal size, shape, and contour.  No adnexal masses or tenderness noted.Bladder is non tender, no masses felt. Rectal: Good sphincter tone, no polyps, or hemorrhoids felt.  Hemoccult negative. Extremities/musculoskeletal:  No swelling or varicosities noted, no  clubbing or cyanosis,has poor circulation right lower leg and it is wrapped.  Psych:  No mood changes, alert and cooperative,seems happy PHQ 9 score 17, is on lexapro, and denies any suicidal ideations. Will add buspar.  Impression:  1. Well woman exam with routine gynecological exam   2. Encounter for colorectal cancer screening   3. Depression, unspecified depression type   4. Essential hypertension      Plan: Continue Provera Continue BP meds as refills Mammogram yearly, had this morning Labs with PCP Physical in 1 year Pap in 2020 Will add buspar to lexapro Rx buspar 5 mg #90 take 1 tid with 3 refills

## 2017-01-25 ENCOUNTER — Other Ambulatory Visit: Payer: Self-pay | Admitting: Adult Health

## 2017-05-05 ENCOUNTER — Encounter (HOSPITAL_COMMUNITY): Payer: Self-pay | Admitting: *Deleted

## 2017-05-05 ENCOUNTER — Emergency Department (HOSPITAL_COMMUNITY)
Admission: EM | Admit: 2017-05-05 | Discharge: 2017-05-05 | Disposition: A | Discharge status: Home / Self Care | Payer: BLUE CROSS/BLUE SHIELD | Attending: Emergency Medicine | Admitting: Emergency Medicine

## 2017-05-05 DIAGNOSIS — Z7984 Long term (current) use of oral hypoglycemic drugs: Secondary | ICD-10-CM | POA: Insufficient documentation

## 2017-05-05 DIAGNOSIS — H9202 Otalgia, left ear: Secondary | ICD-10-CM | POA: Diagnosis present

## 2017-05-05 DIAGNOSIS — I10 Essential (primary) hypertension: Secondary | ICD-10-CM | POA: Diagnosis not present

## 2017-05-05 DIAGNOSIS — Z7982 Long term (current) use of aspirin: Secondary | ICD-10-CM | POA: Insufficient documentation

## 2017-05-05 DIAGNOSIS — E119 Type 2 diabetes mellitus without complications: Secondary | ICD-10-CM | POA: Insufficient documentation

## 2017-05-05 DIAGNOSIS — Z79899 Other long term (current) drug therapy: Secondary | ICD-10-CM | POA: Diagnosis not present

## 2017-05-05 HISTORY — DX: Restless legs syndrome: G25.81

## 2017-05-05 MED ORDER — AMOXICILLIN-POT CLAVULANATE 875-125 MG PO TABS
1.0000 | ORAL_TABLET | Freq: Once | ORAL | Status: AC
  Administered 2017-05-05: 1 via ORAL
  Filled 2017-05-05: qty 1

## 2017-05-05 MED ORDER — AMOXICILLIN-POT CLAVULANATE 875-125 MG PO TABS: 1.0000 | ORAL_TABLET | Freq: Two times a day (BID) | ORAL | 0 refills | Status: DC

## 2017-05-05 MED ORDER — PREDNISONE 10 MG PO TABS: 10.0000 mg | ORAL_TABLET | Freq: Every day | ORAL | 0 refills | Status: DC

## 2017-05-05 MED ORDER — NEOMYCIN-POLYMYXIN-HC 3.5-10000-1 OT SUSP: 3.0000 [drp] | Freq: Three times a day (TID) | OTIC | 0 refills | Status: DC

## 2017-05-05 NOTE — ED Provider Notes (Signed)
[facility]-EMERGENCY DEPT Provider Note   CSN: [id number] Arrival date & time: 05/05/17  0549     History   Chief Complaint Chief Complaint  Patient presents with  . Otalgia    HPI Allison Goodman is a 56 y.o. female.  Left ear pain for several days. Patient was seen by her primary care doctor and prescribed amoxicillin. Additionally, it was noted that there was excessive wax in the ear. She has since used over-the-counter wax removing medicine. Review systems positive for slight disequilibrium. No fever, sweats, chills, stiff neck.  She complains of slight tenderness in the post auricular area.      Past Medical History:  Diagnosis Date  . Cellulitis 12/22/2012   Rx doxy and refer to Dr. Hilda Lias may need hard ware removed  . Depression 12/30/2015  . Diabetes (HCC) 01/03/2016  . Elevated blood sugar   . Fatigue 12/28/2014  . Hypertension   . Irregular bleeding 12/30/2015  . Menopause 12/24/2013  . Obesity   . PMB (postmenopausal bleeding) 12/28/2014  . Restless leg   . Seasonal allergies   . Vitamin D deficiency 01/03/2016    Patient Active Problem List   Diagnosis Date Noted  . Vitamin D deficiency 01/03/2016  . Diabetes (HCC) 01/03/2016  . Depression 12/30/2015  . Irregular bleeding 12/30/2015  . PMB (postmenopausal bleeding) 12/28/2014  . Fatigue 12/28/2014  . Menopause 12/24/2013  . Swelling of limb 05/28/2013  . Leukocytosis, unspecified 03/20/2013  . Hypokalemia 03/20/2013  . Essential hypertension   . Cellulitis of right leg 03/19/2013  . Obesity 11/19/2012    Past Surgical History:  Procedure Laterality Date  . ANKLE SURGERY Right    screws placed    OB History    Gravida Para Term Preterm AB Living   SAB TAB Ectopic Multiple Live Births           2       Home Medications    Prior to Admission medications   Medication Sig Start Date End Date Taking? Authorizing Provider  amoxicillin (AMOXIL) 500 MG capsule Take 500 mg by mouth 3  (three) times daily.   Yes [provider]  aspirin 81 MG tablet Take 81 mg by mouth daily.   Yes [provider]  busPIRone (BUSPAR) 5 MG tablet Take 1 tablet (5 mg total) by mouth 3 (three) times daily. Patient taking differently: Take 15 mg by mouth 3 (three) times daily.  01/17/17  Yes Cyril Mourning A, NP  cetirizine (ZYRTEC) 10 MG tablet Take 10 mg by mouth as needed. Reported on 01/31/2016   Yes [provider]  Cholecalciferol (VITAMIN D3) 5000 units CAPS Take by mouth daily.   Yes [provider]  escitalopram (LEXAPRO) 20 MG tablet Take 20 mg by mouth daily.  12/28/16  Yes [provider]  hydrochlorothiazide (MICROZIDE) 12.5 MG capsule TAKE ONE (1) CAPSULE EACH DAY 01/25/17  Yes Cyril Mourning A, NP  HYDROcodone-acetaminophen (NORCO) 7.5-325 MG per tablet Take 1 tablet by mouth 4 (four) times daily. PT said she averages about 21 tablets weekly   Yes [provider]  lisinopril (PRINIVIL,ZESTRIL) 10 MG tablet TAKE ONE (1) TABLET EACH DAY 01/25/17  Yes Cyril Mourning A, NP  LORazepam (ATIVAN) 1 MG tablet Take by mouth at bedtime.  11/29/16  Yes [provider]  medroxyPROGESTERone (PROVERA) 10 MG tablet Take 1 tablet (10 mg total) by mouth daily. Patient taking differently: Take 10  mg by mouth daily. X 10 days every 3 months 08/02/16  Yes Lazaro Arms, MD  metFORMIN (GLUCOPHAGE) 500 MG tablet TAKE 1 TABLET EVERY MORNING WITH BREAKFAST 01/25/17  Yes Cyril Mourning A, NP  naproxen sodium (ANAPROX) 220 MG tablet Take 220 mg by mouth as needed.   Yes [provider]  rOPINIRole (REQUIP) 0.25 MG tablet Take 0.25 mg by mouth at bedtime. 4 tablets at bedtime   Yes [provider]  amoxicillin-clavulanate (AUGMENTIN) 875-125 MG tablet Take 1 tablet by mouth every 12 (twelve) hours. 05/05/17   Donnetta Hutching, MD  doxycycline (VIBRA-TABS) 100 MG tablet Take 100 mg by mouth 2 (two) times daily.  01/04/17   [provider]  neomycin-polymyxin-hydrocortisone (CORTISPORIN) 3.5-10000-1 OTIC suspension Place 3 drops into the left ear 3 (three) times daily. 05/05/17   Donnetta Hutching, MD  predniSONE (DELTASONE) 10 MG tablet Take 1 tablet (10 mg total) by mouth daily with breakfast. 2 tablets for 3 days, one tablet for 3 days 05/05/17   Donnetta Hutching, MD    Family History Family History  Problem Relation Age of Onset  . COPD Mother   . Diabetes Mother   . Hyperlipidemia Mother   . Hypertension Mother   . Parkinson's disease Father   . Diabetes Brother   . Hyperlipidemia Brother   . Mitral valve prolapse Sister   . Diabetes Brother   . Hyperlipidemia Brother   . Diabetes Sister   . Hyperlipidemia Sister   . Hypertension Sister   . Heart disease Sister        before age 52    Social History Social History  Substance Use Topics  . Smoking status: Never Smoker  . Smokeless tobacco: Never Used  . Alcohol use No     Allergies   Morphine and related   Review of Systems Review of Systems  All other systems reviewed and are negative.    Physical Exam Updated Vital Signs BP (!) 143/88   Pulse 71   Temp 97.9 F (36.6 C) (Oral)   Resp 18   Ht  (1.6 m)   Wt 102.5 kg (226 lb)   SpO2 99%   BMI 40.03 kg/m   Physical Exam  Constitutional: She is oriented to person, place, and time. She appears well-developed and well-nourished.  HENT:  Head: Normocephalic and atraumatic.  Right Ear: External ear normal.  Left ear exam: Tympanic membrane appears pink and normal. There is slight cerumen in the ear canal. Minimal pre-and postauricular tenderness  Eyes: Conjunctivae are normal.  Neck: Neck supple.  Cardiovascular: Normal rate and regular rhythm.   Pulmonary/Chest: Effort normal and breath sounds normal.  Abdominal: Soft. Bowel sounds are normal.  Musculoskeletal: Normal range of motion.  Neurological: She is alert and oriented to person, place, and time.  Skin: Skin is warm and  dry.  Psychiatric: She has a normal mood and affect. Her behavior is normal.  Nursing note and vitals reviewed.    ED Treatments / Results  Labs (all labs ordered are listed, but only abnormal results are displayed) Labs Reviewed - No data to display  EKG  EKG Interpretation None       Radiology No results found.  Procedures Procedures (including critical care time)  Medications Ordered in ED Medications  amoxicillin-clavulanate (AUGMENTIN) 875-125 MG per tablet 1 tablet (not administered)     Initial Impression / Assessment and Plan / ED Course  I have reviewed the triage vital signs and  the nursing notes.  Pertinent labs & imaging results that were available during my care of the patient were reviewed by me and considered in my medical decision making (see chart for details).   Ear exam is essentially normal.  However, secondary to her persistent symptoms, I will prescribe Augmentin and Cortisporin Otic. Also will Rx prednisone in a small dose for anti inflammatory effects  Final Clinical Impressions(s) / ED Diagnoses   Final diagnoses:  Left ear pain    New Prescriptions New Prescriptions   AMOXICILLIN-CLAVULANATE (AUGMENTIN) 875-125 MG TABLET    Take 1 tablet by mouth every 12 (twelve) hours.   NEOMYCIN-POLYMYXIN-HYDROCORTISONE (CORTISPORIN) 3.5-10000-1 OTIC SUSPENSION    Place 3 drops into the left ear 3 (three) times daily.   PREDNISONE (DELTASONE) 10 MG TABLET    Take 1 tablet (10 mg total) by mouth daily with breakfast. 2 tablets for 3 days, one tablet for 3 days     Donnetta Hutching, MD 05/05/17 [id number]

## 2017-05-05 NOTE — ED Triage Notes (Signed)
Pt c/o left ear pain that has been ongoing for close to a week, did see her pcp, was told that she had wax buildup, pt was able to get some of the wax out but continues to have pain with left ear and around left ear, pt did start taking antibiotics that was given by her pcp with no improvement in symptoms,

## 2017-05-05 NOTE — Discharge Instructions (Signed)
Prescription for antibiotic and eardrops. We'll also prescribe a small dose of prednisone for 6 days. This will help the inflammation in your ear.

## 2017-07-08 ENCOUNTER — Ambulatory Visit (INDEPENDENT_AMBULATORY_CARE_PROVIDER_SITE_OTHER): Payer: Worker's Compensation

## 2017-07-08 ENCOUNTER — Encounter: Payer: Self-pay | Admitting: Family

## 2017-07-08 ENCOUNTER — Ambulatory Visit (INDEPENDENT_AMBULATORY_CARE_PROVIDER_SITE_OTHER): Payer: Worker's Compensation | Admitting: Family

## 2017-07-08 VITALS — BP 118/77 | HR 67 | Temp 97.1°F | Ht 63.0 in | Wt 234.4 lb

## 2017-07-08 DIAGNOSIS — S60211A Contusion of right wrist, initial encounter: Secondary | ICD-10-CM | POA: Diagnosis not present

## 2017-07-08 DIAGNOSIS — M25531 Pain in right wrist: Secondary | ICD-10-CM

## 2017-07-08 MED ORDER — NAPROXEN 500 MG PO TABS: 500.0000 mg | ORAL_TABLET | Freq: Two times a day (BID) | ORAL | 1 refills | Status: DC

## 2017-07-08 NOTE — Patient Instructions (Signed)
Contusion A contusion is a deep bruise. Contusions are the result of a blunt injury to tissues and muscle fibers under the skin. The injury causes bleeding under the skin. The skin overlying the contusion may turn blue, purple, or yellow. Minor injuries will give you a painless contusion, but more severe contusions may stay painful and swollen for a few weeks. What are the causes? This condition is usually caused by a blow, trauma, or direct force to an area of the body. What are the signs or symptoms? Symptoms of this condition include:  Swelling of the injured area.  Pain and tenderness in the injured area.  Discoloration. The area may have redness and then turn blue, purple, or yellow. How is this diagnosed? This condition is diagnosed based on a physical exam and medical history. An X-ray, CT scan, or MRI may be needed to determine if there are any associated injuries, such as broken bones (fractures). How is this treated? Specific treatment for this condition depends on what area of the body was injured. In general, the best treatment for a contusion is resting, icing, applying pressure to (compression), and elevating the injured area. This is often called the RICE strategy. Over-the-counter anti-inflammatory medicines may also be recommended for pain control. Follow these instructions at home:  Rest the injured area.  If directed, apply ice to the injured area:  Put ice in a plastic bag.  Place a towel between your skin and the bag.  Leave the ice on for 20 minutes, 2-3 times per day.  If directed, apply light compression to the injured area using an elastic bandage. Make sure the bandage is not wrapped too tightly. Remove and reapply the bandage as directed by your health care provider.  If possible, raise (elevate) the injured area above the level of your heart while you are sitting or lying down.  Take over-the-counter and prescription medicines only as told by your health  care provider. Contact a health care provider if:  Your symptoms do not improve after several days of treatment.  Your symptoms get worse.  You have difficulty moving the injured area. Get help right away if:  You have severe pain.  You have numbness in a hand or foot.  Your hand or foot turns pale or cold. This information is not intended to replace advice given to you by your health care provider. Make sure you discuss any questions you have with your health care provider. Document Released: 05/09/2005 Document Revised: 12/08/2015 Document Reviewed: 12/15/2014 [employer]vier Interactive Patient Education  2017 [employer]  

## 2017-07-08 NOTE — Progress Notes (Signed)
Subjective:    Patient ID: Allison Goodman, female    DOB: 02-24-1961, 56 y.o.   MRN: [id number]  Pt presents to the office today with [employer] Injury for [employer] that occurred 07/02/17. Pt states she was standing there "tieing an end, when the metal separator hit my right wrist".  Wrist Injury   The incident occurred 5 to 7 days ago. The injury mechanism was a direct blow. The pain is present in the right wrist. The quality of the pain is described as aching. The pain does not radiate. The pain is at a severity of 6/10. The pain is moderate. The pain has been intermittent since the incident. Pertinent negatives include no numbness or tingling. The symptoms are aggravated by lifting. She has tried acetaminophen, rest and NSAIDs for the symptoms. The treatment provided mild relief.      Review of Systems  Musculoskeletal:       Right wrist tenderness  Neurological: Negative for tingling and numbness.  All other systems reviewed and are negative.  Family History  Problem Relation Age of Onset  . COPD Mother   . Diabetes Mother   . Hyperlipidemia Mother   . Hypertension Mother   . Parkinson's disease Father   . Diabetes Brother   . Hyperlipidemia Brother   . Mitral valve prolapse Sister   . Diabetes Brother   . Hyperlipidemia Brother   . Diabetes Sister   . Hyperlipidemia Sister   . Hypertension Sister   . Heart disease Sister        before age 860    Social History   Socioeconomic History  . Marital status: Divorced    Spouse name: None  . Number of children: None  . Years of education: None  . Highest education level: None  Social Needs  . Financial resource strain: None  . Food insecurity - worry: None  . Food insecurity - inability: None  . Transportation needs - medical: None  . Transportation needs - non-medical: None  Occupational History  . None  Tobacco Use  . Smoking status: Never Smoker  . Smokeless tobacco: Never Used  Substance and Sexual  Activity  . Alcohol use: No  . Drug use: No  . Sexual activity: Not Currently    Birth control/protection: None  Other Topics Concern  . None  Social History Narrative  . None       Objective:   Physical Exam  Constitutional: She is oriented to person, place, and time. She appears well-developed and well-nourished. No distress.  HENT:  Head: Normocephalic.  Eyes: Pupils are equal, round, and reactive to light.  Neck: Normal range of motion. Neck supple. No thyromegaly present.  Cardiovascular: Normal rate, regular rhythm, normal heart sounds and intact distal pulses.  No murmur heard. Pulmonary/Chest: Effort normal and breath sounds normal. No respiratory distress. She has no wheezes.  Abdominal: Soft. Bowel sounds are normal. She exhibits no distension. There is no tenderness.  Musculoskeletal: Normal range of motion. She exhibits tenderness. She exhibits no edema.  Right medial wrist tenderness with mild swelling and ecchymosis present  Neurological: She is alert and oriented to person, place, and time.  Skin: Skin is warm and dry.  Psychiatric: She has a normal mood and affect. Her behavior is normal. Judgment and thought content normal.  Vitals reviewed.  Wrist- Negative Preliminary reading by Jannifer Rodneyhristy Janesia Joswick, FNP [facility]   BP 118/77   Pulse 67   Temp (!) 97.1 F (36.2 C) (  Oral)   Ht 5\' 3"  (1.6 m)   Wt 234 lb 6.4 oz (106.3 kg)   BMI 41.52 kg/m      Assessment & Plan:  1. Right wrist pain - DG Wrist Complete Right; Future - naproxen (NAPROSYN) 500 MG tablet; Take 1 tablet (500 mg total) by mouth 2 (two) times daily with a meal.  Dispense: 60 tablet; Refill: 1  2. Contusion of right wrist, initial encounter Rest Ice Light duty for next 3 days and then may resume regular duties RTO prn or if pain does not improve or worsens - naproxen (NAPROSYN) 500 MG tablet; Take 1 tablet (500 mg total) by mouth 2 (two) times daily with a meal.  Dispense: 60 tablet; Refill:  1  Jannifer Rodneyhristy Encarnacion Scioneaux, FNP

## 2017-07-25 ENCOUNTER — Ambulatory Visit: Payer: BLUE CROSS/BLUE SHIELD | Admitting: Family Medicine

## 2017-07-25 ENCOUNTER — Encounter: Payer: Self-pay | Admitting: *Deleted

## 2017-07-25 ENCOUNTER — Encounter: Payer: Self-pay | Admitting: Family Medicine

## 2017-07-25 VITALS — BP 141/82 | HR 66 | Temp 97.1°F | Ht 63.0 in | Wt 235.0 lb

## 2017-07-25 DIAGNOSIS — A084 Viral intestinal infection, unspecified: Secondary | ICD-10-CM

## 2017-07-25 DIAGNOSIS — R509 Fever, unspecified: Secondary | ICD-10-CM | POA: Diagnosis not present

## 2017-07-25 DIAGNOSIS — R52 Pain, unspecified: Secondary | ICD-10-CM | POA: Diagnosis not present

## 2017-07-25 LAB — URINALYSIS
Bilirubin, UA: NEGATIVE
Glucose, UA: NEGATIVE
Ketones, UA: NEGATIVE
LEUKOCYTES UA: NEGATIVE
Nitrite, UA: NEGATIVE
PH UA: 6 (ref 5.0–7.5)
PROTEIN UA: NEGATIVE
SPEC GRAV UA: 1.025 (ref 1.005–1.030)
Urobilinogen, Ur: 0.2 mg/dL (ref 0.2–1.0)

## 2017-07-25 LAB — VERITOR FLU A/B WAIVED
INFLUENZA A: NEGATIVE
Influenza B: NEGATIVE

## 2017-07-25 NOTE — Progress Notes (Signed)
BP 139/90   Pulse 66   Temp (!) 97.1 F (36.2 C) (Oral)   Ht 5' 3"  (1.6 m)   Wt 235 lb (106.6 kg)   BMI 41.63 kg/m    Subjective:    Patient ID: Allison Goodman, female    DOB: [birth year], 56 y.o.   MRN: [id number]  HPI: Allison Goodman is a 56 y.o. female presenting on 07/25/2017 for Headache, body aches, vomiting, chills (began 3 days ago, started with nausea and vomiting but that has improved)   HPI Nausea and vomiting and chills Patient comes in with nausea and vomiting and chills that is been going on for 3 days.  The nausea and diarrhea and vomiting have improved.  She says she has some generalized abdominal pain that is mild but not anything severe in 19 pinpointed to any region but otherwise does not have any symptoms.  She maybe has a little bit of nasal congestion but no cough or drainage or shortness of breath or wheezing.  She denies any urinary symptoms.  Relevant past medical, surgical, family and social history reviewed and updated as indicated. Interim medical history since our last visit reviewed. Allergies and medications reviewed and updated.  Review of Systems  Constitutional: Positive for chills and fever.  HENT: Positive for congestion. Negative for ear discharge, ear pain, postnasal drip, rhinorrhea and sinus pain.   Eyes: Negative for visual disturbance.  Respiratory: Negative for cough, chest tightness and shortness of breath.   Cardiovascular: Negative for chest pain and leg swelling.  Gastrointestinal: Positive for abdominal pain, diarrhea, nausea and vomiting. Negative for blood in stool and constipation.  Genitourinary: Negative for difficulty urinating and dysuria.  Musculoskeletal: Negative for back pain and gait problem.  Skin: Negative for rash.  Neurological: Negative for light-headedness and headaches.  Psychiatric/Behavioral: Negative for agitation and behavioral problems.  All other systems reviewed and are negative.   Per HPI unless  specifically indicated above        Objective:    BP 139/90   Pulse 66   Temp (!) 97.1 F (36.2 C) (Oral)   Ht 5' 3"  (1.6 m)   Wt 235 lb (106.6 kg)   BMI 41.63 kg/m   Wt Readings from Last 3 Encounters:  07/25/17 235 lb (106.6 kg)  07/08/17 234 lb 6.4 oz (106.3 kg)  05/05/17 226 lb (102.5 kg)    Physical Exam  Constitutional: She is oriented to person, place, and time. She appears well-developed and well-nourished. No distress.  HENT:  Right Ear: External ear normal.  Left Ear: External ear normal.  Nose: Nose normal.  Mouth/Throat: Oropharynx is clear and moist. No oropharyngeal exudate.  Eyes: Conjunctivae are normal.  Neck: Neck supple. No thyromegaly present.  Cardiovascular: Normal rate, regular rhythm, normal heart sounds and intact distal pulses.  No murmur heard. Pulmonary/Chest: Effort normal and breath sounds normal. No respiratory distress. She has no wheezes. She has no rales.  Abdominal: Soft. Bowel sounds are normal. She exhibits no distension. There is tenderness (Mild diffuse abdominal tenderness, no rebound or guarding).  Musculoskeletal: Normal range of motion.  Lymphadenopathy:    She has no cervical adenopathy.  Neurological: She is alert and oriented to person, place, and time. Coordination normal.  Skin: Skin is warm and dry. No rash noted. She is not diaphoretic.  Psychiatric: She has a normal mood and affect. Her behavior is normal.  Nursing note and vitals reviewed.       Assessment & Plan:  Problem List Items Addressed This Visit    None    Visit Diagnoses    Body aches    -  Primary   Relevant Orders   Veritor Flu A/B Waived   CBC with Differential/Platelet   CMP14+EGFR   Urinalysis   Urine Culture   Fever and chills       Relevant Orders   CBC with Differential/Platelet   CMP14+EGFR   Urinalysis   Urine Culture   Viral gastroenteritis       Symptoms improving, fever could be due to this but will do blood work and urine to  make sure       Follow up plan: Return if symptoms worsen or fail to improve.  Counseling provided for all of the vaccine components Orders Placed This Encounter  Procedures  . Urine Culture  . Veritor Flu A/B Waived  . CBC with Differential/Platelet  . CMP14+EGFR  . Urinalysis    Caryl Pina, MD [facility] 07/25/2017, 5:31 PM

## 2017-07-27 LAB — CMP14+EGFR
ALK PHOS: 86 IU/L (ref 39–117)
ALT: 16 IU/L (ref 0–32)
AST: 20 IU/L (ref 0–40)
Albumin/Globulin Ratio: 1.6 (ref 1.2–2.2)
Albumin: 4.4 g/dL (ref 3.5–5.5)
BUN/Creatinine Ratio: 43 — ABNORMAL HIGH (ref 9–23)
BUN: 26 mg/dL — AB (ref 6–24)
CHLORIDE: 98 mmol/L (ref 96–106)
CO2: 27 mmol/L (ref 20–29)
CREATININE: 0.61 mg/dL (ref 0.57–1.00)
Calcium: 9.4 mg/dL (ref 8.7–10.2)
GFR calc Af Amer: 119 mL/min/{1.73_m2} (ref >59)
GFR calc non Af Amer: 103 mL/min/{1.73_m2} (ref >59)
Globulin, Total: 2.7 g/dL (ref 1.5–4.5)
Glucose: 101 mg/dL — ABNORMAL HIGH (ref 65–99)
Potassium: 4.5 mmol/L (ref 3.5–5.2)
Sodium: 142 mmol/L (ref 134–144)
Total Protein: 7.1 g/dL (ref 6.0–8.5)

## 2017-07-27 LAB — CBC WITH DIFFERENTIAL/PLATELET
BASOS ABS: 0 10*3/uL (ref 0.0–0.2)
Basos: 0 %
EOS (ABSOLUTE): 0.3 10*3/uL (ref 0.0–0.4)
Eos: 5 %
Hematocrit: 40.1 % (ref 34.0–46.6)
Hemoglobin: 13.2 g/dL (ref 11.1–15.9)
IMMATURE GRANS (ABS): 0 10*3/uL (ref 0.0–0.1)
Immature Granulocytes: 0 %
LYMPHS ABS: 2.6 10*3/uL (ref 0.7–3.1)
LYMPHS: 38 %
MCH: 29.3 pg (ref 26.6–33.0)
MCHC: 32.9 g/dL (ref 31.5–35.7)
MCV: 89 fL (ref 79–97)
MONOCYTES: 11 %
Monocytes Absolute: 0.8 10*3/uL (ref 0.1–0.9)
NEUTROS ABS: 3.2 10*3/uL (ref 1.4–7.0)
Neutrophils: 46 %
PLATELETS: 329 10*3/uL (ref 150–379)
RBC: 4.51 x10E6/uL (ref 3.77–5.28)
RDW: 12.9 % (ref 12.3–15.4)
WBC: 7 10*3/uL (ref 3.4–10.8)

## 2017-07-27 LAB — URINE CULTURE

## 2017-07-29 ENCOUNTER — Ambulatory Visit: Payer: BLUE CROSS/BLUE SHIELD | Admitting: Obstetrics & Gynecology

## 2017-08-13 DIAGNOSIS — M25571 Pain in right ankle and joints of right foot: Secondary | ICD-10-CM | POA: Insufficient documentation

## 2017-08-13 DIAGNOSIS — Z7984 Long term (current) use of oral hypoglycemic drugs: Secondary | ICD-10-CM | POA: Diagnosis not present

## 2017-08-13 DIAGNOSIS — E119 Type 2 diabetes mellitus without complications: Secondary | ICD-10-CM | POA: Diagnosis not present

## 2017-08-13 DIAGNOSIS — Z79899 Other long term (current) drug therapy: Secondary | ICD-10-CM | POA: Insufficient documentation

## 2017-08-13 DIAGNOSIS — I10 Essential (primary) hypertension: Secondary | ICD-10-CM | POA: Diagnosis not present

## 2017-08-13 DIAGNOSIS — Z7982 Long term (current) use of aspirin: Secondary | ICD-10-CM | POA: Insufficient documentation

## 2017-08-14 ENCOUNTER — Telehealth: Payer: Self-pay | Admitting: Orthopaedic Surgery

## 2017-08-14 ENCOUNTER — Other Ambulatory Visit: Payer: Self-pay

## 2017-08-14 ENCOUNTER — Encounter: Payer: Self-pay | Admitting: Orthopaedic Surgery

## 2017-08-14 ENCOUNTER — Ambulatory Visit (HOSPITAL_COMMUNITY)
Admission: RE | Admit: 2017-08-14 | Discharge: 2017-08-14 | Disposition: A | Discharge status: Home / Self Care | Payer: BLUE CROSS/BLUE SHIELD | Source: Ambulatory Visit | Attending: Emergency Medicine | Admitting: Emergency Medicine

## 2017-08-14 ENCOUNTER — Encounter (HOSPITAL_COMMUNITY): Payer: Self-pay | Admitting: Emergency Medicine

## 2017-08-14 ENCOUNTER — Ambulatory Visit: Payer: BLUE CROSS/BLUE SHIELD | Admitting: Orthopaedic Surgery

## 2017-08-14 ENCOUNTER — Ambulatory Visit: Payer: BLUE CROSS/BLUE SHIELD | Admitting: Obstetrics & Gynecology

## 2017-08-14 ENCOUNTER — Emergency Department (HOSPITAL_COMMUNITY): Payer: BLUE CROSS/BLUE SHIELD

## 2017-08-14 ENCOUNTER — Emergency Department (HOSPITAL_COMMUNITY)
Admission: EM | Admit: 2017-08-14 | Discharge: 2017-08-14 | Disposition: A | Discharge status: Home / Self Care | Payer: BLUE CROSS/BLUE SHIELD | Attending: Emergency Medicine | Admitting: Emergency Medicine

## 2017-08-14 VITALS — BP 148/89 | HR 89 | Ht 64.0 in | Wt 237.0 lb

## 2017-08-14 DIAGNOSIS — M79604 Pain in right leg: Secondary | ICD-10-CM

## 2017-08-14 DIAGNOSIS — G8929 Other chronic pain: Secondary | ICD-10-CM | POA: Diagnosis not present

## 2017-08-14 DIAGNOSIS — M25571 Pain in right ankle and joints of right foot: Secondary | ICD-10-CM

## 2017-08-14 DIAGNOSIS — F419 Anxiety disorder, unspecified: Secondary | ICD-10-CM

## 2017-08-14 HISTORY — DX: Anxiety disorder, unspecified: F41.9

## 2017-08-14 MED ORDER — ENOXAPARIN SODIUM 120 MG/0.8ML ~~LOC~~ SOLN
1.0000 mg/kg | Freq: Once | SUBCUTANEOUS | Status: DC
  Filled 2017-08-14: qty 0.8

## 2017-08-14 MED ORDER — TRAMADOL HCL 50 MG PO TABS: 50.0000 mg | ORAL_TABLET | Freq: Four times a day (QID) | ORAL | 0 refills | Status: DC | PRN

## 2017-08-14 MED ORDER — ENOXAPARIN SODIUM 100 MG/ML ~~LOC~~ SOLN
100.0000 mg | Freq: Once | SUBCUTANEOUS | Status: AC
  Administered 2017-08-14: 100 mg via SUBCUTANEOUS
  Filled 2017-08-14: qty 1

## 2017-08-14 MED ORDER — HYDROCODONE-ACETAMINOPHEN 5-325 MG PO TABS
1.0000 | ORAL_TABLET | Freq: Once | ORAL | Status: AC
  Administered 2017-08-14: 1 via ORAL
  Filled 2017-08-14: qty 1

## 2017-08-14 NOTE — ED Triage Notes (Signed)
Pt states she had surgery on her right ankle in 2008, has screws and a plate that now feels like its "digging" in, pt c/o right ankle itching and increased ankle pain x 3 days

## 2017-08-14 NOTE — Progress Notes (Signed)
Subjective:    Patient ID: Allison Goodman, female    DOB: Jul 04, 1961, 57 y.o.   MRN: [id number]  HPI She had a bimalleolar fracture of the right ankle in 2008 treated by Dr. Arletha [place]omaritis in [place].  She later developed a blood clot in the right lower leg.  In 2014 she got cellulitis of the right lower leg and an ulceration on the medial side of the lower leg and ankle.  She has developed some pain over the pins from the prior ankle surgery.  She was seen last night in the ER for this and they were concerned about a possible new DVT.  She had a Doppler study done which was negative a short time ago.  She had x-rays which show the hardware from the prior surgery with no new injury.  The hardware is in good position.  She has considered removal of the hardware.  I have told her she would need to be on a blood thinner after surgery for a while.  And there is a chance she could get cellulitis again.  She will discuss this with her family and her family doctor.  She has appointment with the family doctor next month.  If she wants to proceed to have the hardware removed, she will call.   Review of Systems  HENT: Positive for congestion.   Respiratory: Negative for cough and shortness of breath.   Cardiovascular: Positive for leg swelling. Negative for chest pain.  Endocrine: Positive for cold intolerance.  Musculoskeletal: Positive for arthralgias, gait problem and joint swelling.  Allergic/Immunologic: Positive for environmental allergies.  Psychiatric/Behavioral: The patient is nervous/anxious.    Past Medical History:  Diagnosis Date  . Anxiety 08/14/2017  . Cellulitis 12/22/2012   Rx doxy and refer to Dr. Hilda LiasKeeling may need hard ware removed  . Depression 12/30/2015  . Diabetes (HCC) 01/03/2016  . Elevated blood sugar   . Fatigue 12/28/2014  . Hypertension   . Irregular bleeding 12/30/2015  . Menopause 12/24/2013  . Obesity   . PMB (postmenopausal bleeding) 12/28/2014  . Restless leg   .  Seasonal allergies   . Vitamin D deficiency 01/03/2016    Past Surgical History:  Procedure Laterality Date  . ANKLE SURGERY Right    screws placed    Current Outpatient Medications on File Prior to Visit  Medication Sig Dispense Refill  . aspirin 81 MG tablet Take 81 mg by mouth daily.    . busPIRone (BUSPAR) 15 MG tablet Take 7.5 mg by mouth 3 (three) times daily.  2  . cetirizine (ZYRTEC) 10 MG tablet Take 10 mg by mouth as needed. Reported on 01/31/2016    . Cholecalciferol (VITAMIN D3) 5000 units CAPS Take by mouth daily.    Marland Kitchen. escitalopram (LEXAPRO) 20 MG tablet Take 20 mg by mouth daily.   10  . hydrochlorothiazide (MICROZIDE) 12.5 MG capsule TAKE ONE (1) CAPSULE EACH DAY 90 capsule 4  . HYDROcodone-acetaminophen (NORCO) 7.5-325 MG per tablet Take 1 tablet by mouth 4 (four) times daily. PT said she averages about 21 tablets weekly    . lisinopril (PRINIVIL,ZESTRIL) 10 MG tablet TAKE ONE (1) TABLET EACH DAY 90 tablet 4  . LORazepam (ATIVAN) 1 MG tablet Take by mouth at bedtime.   4  . medroxyPROGESTERone (PROVERA) 10 MG tablet Take 1 tablet (10 mg total) by mouth daily. (Patient taking differently: Take 10 mg by mouth daily. X 10 days every 3 months) 30 tablet 4  . metFORMIN (GLUCOPHAGE)  500 MG tablet TAKE 1 TABLET EVERY MORNING WITH BREAKFAST 90 tablet 4  . naproxen sodium (ANAPROX) 220 MG tablet Take 220 mg by mouth as needed.    Marland Kitchen rOPINIRole (REQUIP) 0.25 MG tablet Take 0.25 mg by mouth at bedtime. 4 tablets at bedtime    . traMADol (ULTRAM) 50 MG tablet Take 1 tablet (50 mg total) by mouth every 6 (six) hours as needed. 15 tablet 0   No current facility-administered medications on file prior to visit.     Social History   Socioeconomic History  . Marital status: Divorced    Spouse name: Not on file  . Number of children: Not on file  . Years of education: Not on file  . Highest education level: Not on file  Social Needs  . Financial resource strain: Not on file  . Food  insecurity - worry: Not on file  . Food insecurity - inability: Not on file  . Transportation needs - medical: Not on file  . Transportation needs - non-medical: Not on file  Occupational History  . Not on file  Tobacco Use  . Smoking status: Never Smoker  . Smokeless tobacco: Never Used  Substance and Sexual Activity  . Alcohol use: No  . Drug use: No  . Sexual activity: Not Currently    Birth control/protection: None  Other Topics Concern  . Not on file  Social History Narrative  . Not on file    Family History  Problem Relation Age of Onset  . COPD Mother   . Diabetes Mother   . Hyperlipidemia Mother   . Hypertension Mother   . Parkinson's disease Father   . Diabetes Brother   . Hyperlipidemia Brother   . Mitral valve prolapse Sister   . Diabetes Brother   . Hyperlipidemia Brother   . Diabetes Sister   . Hyperlipidemia Sister   . Hypertension Sister   . Heart disease Sister        before age 55    BP (!) 148/89   Pulse 89   Ht 5\' 4"  (1.626 m)   Wt 237 lb (107.5 kg)   BMI 40.68 kg/m       Objective:   Physical Exam  Constitutional: She is oriented to person, place, and time. She appears well-developed and well-nourished.  HENT:  Head: Normocephalic and atraumatic.  Eyes: Conjunctivae and EOM are normal. Pupils are equal, round, and reactive to light.  Neck: Normal range of motion. Neck supple.  Cardiovascular: Normal rate, regular rhythm and intact distal pulses.  Pulmonary/Chest: Effort normal.  Abdominal: Soft.  Musculoskeletal: She exhibits tenderness (She has skin color changes of the lower leg just above ankle, there is slight tenderness over the hardware lateral malleolus area, no redness, NV intact, ROM is good, no limp.  Left ankle normal.).  Neurological: She is alert and oriented to person, place, and time. She displays normal reflexes. No cranial nerve deficit. She exhibits normal muscle tone. Coordination normal.  Skin: Skin is warm and dry.    Psychiatric: She has a normal mood and affect. Her behavior is normal. Judgment and thought content normal.  Vitals reviewed.         Assessment & Plan:   Encounter Diagnosis  Name Primary?  . Chronic pain of right ankle Yes   She will decide if she would like to have hardware removed and let us know.  Call if any problem.  Precautions discussed.   Electronically Signed Darreld Mclean, MD 1/2/20192:14  PM

## 2017-08-14 NOTE — Telephone Encounter (Signed)
Patient called to request to schedule with Dr Hilda LiasKeeling - asking to be seen this afternoon - [name] for right ankle pain,itching,swelling - states Dr Hilda LiasKeeling has seen her in past. Relays history of surgery on this ankle10 yrs ago by Dr Arletha GrippePlomaritis, w/hardware.  York SpanielSaid has Ultrasound scheduled this morning to rule out "blood clot."  Please advise.  Patient's cell# is [phone].

## 2017-08-14 NOTE — Discharge Instructions (Signed)
Your x-ray today is stable.  It appears that 1 of your screws might be too long.  Follow-up with Dr. Hilda LiasKeeling or other orthopedic surgeon.  Return to the hospital tomorrow for an ultrasound study of your right leg.  Return to the ED if you develop chest pain, shortness of breath, new or worsening symptoms.

## 2017-08-14 NOTE — ED Provider Notes (Signed)
[facility] EMERGENCY DEPARTMENT Provider Note   CSN: [id number] Arrival date & time: 08/13/17  2358     History   Chief Complaint Chief Complaint  Patient presents with  . Ankle Pain    HPI Allison Goodman is a 57 y.o. female.  Patient reports right ankle pain over the past 2 days.  Has increased itching and pain with trying to ambulate.  Denies any new falls or trauma.  She had surgery with plates and screws to her ankle in 2008 by a surgeon in [place] (Dr. Arletha Grippe) who is no longer in practice.  She has not had any issues with her ankle up until now.  She been taking naproxen without relief.  She denies any fever, chills, nausea or vomiting.  No focal weakness, numbness or tingling.  Patient reports a blood clot in the past but was only anticoagulated with aspirin.  Denies any chest pain or shortness of breath.  Denies any abdominal pain, nausea, vomiting, dysuria hematuria.   The history is provided by the patient.    Past Medical History:  Diagnosis Date  . Anxiety 08/14/2017  . Cellulitis 12/22/2012   Rx doxy and refer to Dr. Hilda Lias may need hard ware removed  . Depression 12/30/2015  . Diabetes (HCC) 01/03/2016  . Elevated blood sugar   . Fatigue 12/28/2014  . Hypertension   . Irregular bleeding 12/30/2015  . Menopause 12/24/2013  . Obesity   . PMB (postmenopausal bleeding) 12/28/2014  . Restless leg   . Seasonal allergies   . Vitamin D deficiency 01/03/2016    Patient Active Problem List   Diagnosis Date Noted  . Vitamin D deficiency 01/03/2016  . Diabetes (HCC) 01/03/2016  . Depression 12/30/2015  . Irregular bleeding 12/30/2015  . PMB (postmenopausal bleeding) 12/28/2014  . Fatigue 12/28/2014  . Menopause 12/24/2013  . Swelling of limb 05/28/2013  . Leukocytosis, unspecified 03/20/2013  . Hypokalemia 03/20/2013  . Essential hypertension   . Cellulitis of right leg 03/19/2013  . Obesity 11/19/2012    Past Surgical History:  Procedure Laterality Date    . ANKLE SURGERY Right    screws placed    OB History    Gravida Para Term Preterm AB Living   2 2 2     2    SAB TAB Ectopic Multiple Live Births           2       Home Medications    Prior to Admission medications   Medication Sig Start Date End Date Taking? Authorizing Provider  aspirin 81 MG tablet Take 81 mg by mouth daily.    [provider]  busPIRone (BUSPAR) 15 MG tablet Take 7.5 mg by mouth 3 (three) times daily. 06/24/17   [provider]  cetirizine (ZYRTEC) 10 MG tablet Take 10 mg by mouth as needed. Reported on 01/31/2016    [provider]  Cholecalciferol (VITAMIN D3) 5000 units CAPS Take by mouth daily.    [provider]  escitalopram (LEXAPRO) 20 MG tablet Take 20 mg by mouth daily.  12/28/16   [provider]  hydrochlorothiazide (MICROZIDE) 12.5 MG capsule TAKE ONE (1) CAPSULE EACH DAY 01/25/17   Cyril Mourning A, NP  HYDROcodone-acetaminophen (NORCO) 7.5-325 MG per tablet Take 1 tablet by mouth 4 (four) times daily. PT said she averages about 21 tablets weekly    [provider]  lisinopril (PRINIVIL,ZESTRIL) 10 MG tablet TAKE ONE (1) TABLET EACH DAY 01/25/17   Adline Potter,  NP  LORazepam (ATIVAN) 1 MG tablet Take by mouth at bedtime.  11/29/16   [provider]  medroxyPROGESTERone (PROVERA) 10 MG tablet Take 1 tablet (10 mg total) by mouth daily. Patient taking differently: Take 10 mg by mouth daily. X 10 days every 3 months 08/02/16   Lazaro ArmsEure, Luther H, MD  metFORMIN (GLUCOPHAGE) 500 MG tablet TAKE 1 TABLET EVERY MORNING WITH BREAKFAST 01/25/17   Cyril MourningGriffin, Jennifer A, NP  naproxen sodium (ANAPROX) 220 MG tablet Take 220 mg by mouth as needed.    [provider]  rOPINIRole (REQUIP) 0.25 MG tablet Take 0.25 mg by mouth at bedtime. 4 tablets at bedtime    [provider]    Family History Family History  Problem Relation Age of Onset  . COPD Mother   . Diabetes Mother   .  Hyperlipidemia Mother   . Hypertension Mother   . Parkinson's disease Father   . Diabetes Brother   . Hyperlipidemia Brother   . Mitral valve prolapse Sister   . Diabetes Brother   . Hyperlipidemia Brother   . Diabetes Sister   . Hyperlipidemia Sister   . Hypertension Sister   . Heart disease Sister        before age 55    Social History Social History   Tobacco Use  . Smoking status: Never Smoker  . Smokeless tobacco: Never Used  Substance Use Topics  . Alcohol use: No  . Drug use: No     Allergies   Morphine and related   Review of Systems Review of Systems  Constitutional: Negative for activity change, appetite change and fever.  HENT: Negative for congestion.   Respiratory: Negative for cough, chest tightness and shortness of breath.   Cardiovascular: Negative for chest pain.  Gastrointestinal: Negative for abdominal pain, nausea and vomiting.  Genitourinary: Negative for dysuria, hematuria, vaginal bleeding and vaginal discharge.  Musculoskeletal: Positive for arthralgias and myalgias. Negative for back pain and neck pain.  Skin: Positive for wound. Negative for rash.  Neurological: Negative for dizziness, weakness and headaches.    all other systems are negative except as noted in the HPI and PMH.    Physical Exam Updated Vital Signs BP 129/78 (BP Location: Right Arm)   Pulse 90   Temp 97.9 F (36.6 C) (Oral)   Resp 18   Ht 5\' 3"  (1.6 m)   Wt 106.6 kg (235 lb)   SpO2 99%   BMI 41.63 kg/m   Physical Exam  Constitutional: She is oriented to person, place, and time. She appears well-developed and well-nourished. No distress.  HENT:  Head: Normocephalic and atraumatic.  Mouth/Throat: Oropharynx is clear and moist. No oropharyngeal exudate.  Eyes: Conjunctivae and EOM are normal. Pupils are equal, round, and reactive to light.  Neck: Normal range of motion. Neck supple.  No meningismus.  Cardiovascular: Normal rate, regular rhythm, normal heart  sounds and intact distal pulses.  No murmur heard. Pulmonary/Chest: Effort normal and breath sounds normal. No respiratory distress.  Abdominal: Soft. There is no tenderness. There is no rebound and no guarding.  Musculoskeletal: Normal range of motion. She exhibits tenderness. She exhibits no edema.  Chronic venous stasis changes to right lower leg. Tenderness to palpation of right lateral and medial malleoli without deformity.  Ankle flexion extension intact.  Intact DP and PT pulses.  Right calf slightly larger than the left.  There is some calf tenderness to palpation.  Neurological: She is alert and oriented to person, place,  and time. No cranial nerve deficit. She exhibits normal muscle tone. Coordination normal.  No ataxia on finger to nose bilaterally. No pronator drift. 5/5 strength throughout. CN 2-12 intact.Equal grip strength. Sensation intact.   Skin: Skin is warm.  Psychiatric: She has a normal mood and affect. Her behavior is normal.  Nursing note and vitals reviewed.        ED Treatments / Results  Labs (all labs ordered are listed, but only abnormal results are displayed) Labs Reviewed - No data to display  EKG  EKG Interpretation None       Radiology Dg Ankle Complete Right  Result Date: 08/14/2017 CLINICAL DATA:  Acute onset of right ankle pain. EXAM: RIGHT ANKLE - COMPLETE 3+ VIEW COMPARISON:  None. FINDINGS: There is no evidence of fracture or dislocation. Slight lucency about the patient's tibial screw is borderline normal. The plate and screws along the distal fibula are grossly unremarkable in appearance. The ankle mortise is intact; the interosseous space is within normal limits. No talar tilt or subluxation is seen. A small plantar calcaneal spur is noted. The joint spaces are preserved. No significant soft tissue abnormalities are seen. IMPRESSION: 1. No evidence of fracture or dislocation. 2. Visualized hardware appears grossly intact. Electronically  Signed   By: Roanna Raider M.D.   On: 08/14/2017 00:55    Procedures Procedures (including critical care time)  Medications Ordered in ED Medications - No data to display   Initial Impression / Assessment and Plan / ED Course  I have reviewed the triage vital signs and the nursing notes.  Pertinent labs & imaging results that were available during my care of the patient were reviewed by me and considered in my medical decision making (see chart for details).    Worsening right ankle and leg pain.  Neurovascularly intact.  Did have surgery 10 years ago.  Unclear superficial thrombophlebitis versus DVT in the past.  X-ray today shows no hardware failure or acute fracture.  Patient will be given dose of Lovenox and return tomorrow for DVT study.  Needs to establish care with orthopedic surgeon.  Patient states she has seen Dr. Hilda Lias in the past and will return to him. If she has continued pain, she may need hardware removal.  Doppler study tomorrow.  Follow-up with orthopedics.  Return precautions discussed.  Final Clinical Impressions(s) / ED Diagnoses   Final diagnoses:  Acute right ankle pain    ED Discharge Orders    None       Kamin Niblack, Jeannett Senior, MD 08/14/17 0211

## 2017-08-14 NOTE — ED Provider Notes (Signed)
Pt returns after having venous doppler of RLE, negative for dvt or superficial thrombophlebitis.  She has appt with Dr. Hilda LiasKeeling today for f/u care of her right ankle pain.  Results given, prn f/u here anticipated.     Burgess Amordol, Rashidi Loh, PA-C 08/14/17 1209    Doug SouJacubowitz, Sam, MD 08/14/17 [phone]

## 2017-08-14 NOTE — Telephone Encounter (Signed)
I am here

## 2017-08-22 ENCOUNTER — Ambulatory Visit: Payer: BLUE CROSS/BLUE SHIELD | Admitting: Obstetrics & Gynecology

## 2017-09-09 ENCOUNTER — Other Ambulatory Visit: Payer: Self-pay | Admitting: Family

## 2017-09-09 DIAGNOSIS — M25531 Pain in right wrist: Secondary | ICD-10-CM

## 2017-09-09 DIAGNOSIS — S60211A Contusion of right wrist, initial encounter: Secondary | ICD-10-CM

## 2017-09-19 ENCOUNTER — Ambulatory Visit: Payer: BLUE CROSS/BLUE SHIELD | Admitting: Obstetrics & Gynecology

## 2017-09-27 ENCOUNTER — Encounter: Payer: Self-pay | Admitting: Obstetrics & Gynecology

## 2017-09-27 ENCOUNTER — Ambulatory Visit: Payer: BLUE CROSS/BLUE SHIELD | Admitting: Obstetrics & Gynecology

## 2017-09-27 VITALS — BP 122/60 | Ht 63.0 in | Wt 237.5 lb

## 2017-09-27 DIAGNOSIS — N924 Excessive bleeding in the premenopausal period: Secondary | ICD-10-CM

## 2017-09-27 MED ORDER — MEDROXYPROGESTERONE ACETATE 10 MG PO TABS: 10.0000 mg | ORAL_TABLET | Freq: Every day | ORAL | 4 refills | Status: DC

## 2017-09-27 NOTE — Progress Notes (Signed)
Chief Complaint  Patient presents with  . Follow-up    on Provera      57 y.o. [id number] Patient's last menstrual period was 08/23/2017. The current method of family planning is tubal ligation.  Outpatient Encounter Medications as of 09/27/2017  Medication Sig  . aspirin 81 MG tablet Take 81 mg by mouth daily.  . Cholecalciferol (VITAMIN D3) 5000 units CAPS Take by mouth daily.  . hydrochlorothiazide (MICROZIDE) 12.5 MG capsule TAKE ONE (1) CAPSULE EACH DAY  . HYDROcodone-acetaminophen (NORCO) 7.5-325 MG per tablet Take 1 tablet by mouth 4 (four) times daily. PT said she averages about 21 tablets weekly  . lisinopril (PRINIVIL,ZESTRIL) 10 MG tablet TAKE ONE (1) TABLET EACH DAY  . LORazepam (ATIVAN) 1 MG tablet Take by mouth at bedtime.   . medroxyPROGESTERone (PROVERA) 10 MG tablet Take 1 tablet (10 mg total) by mouth daily.  . metFORMIN (GLUCOPHAGE) 500 MG tablet TAKE 1 TABLET EVERY MORNING WITH BREAKFAST  . naproxen sodium (ANAPROX) 220 MG tablet Take 220 mg by mouth as needed.  Marland Kitchen rOPINIRole (REQUIP) 0.25 MG tablet Take 0.25 mg by mouth at bedtime. 4 tablets at bedtime  . [DISCONTINUED] busPIRone (BUSPAR) 15 MG tablet Take 7.5 mg by mouth 3 (three) times daily.  . [DISCONTINUED] cetirizine (ZYRTEC) 10 MG tablet Take 10 mg by mouth as needed. Reported on 01/31/2016  . [DISCONTINUED] escitalopram (LEXAPRO) 20 MG tablet Take 20 mg by mouth daily.   . [DISCONTINUED] medroxyPROGESTERone (PROVERA) 10 MG tablet Take 1 tablet (10 mg total) by mouth daily. (Patient taking differently: Take 10 mg by mouth daily. X 10 days every 3 months)  . [DISCONTINUED] naproxen (NAPROSYN) 500 MG tablet TAKE ONE TABLET TWICE A DAY WITH FOOD  . [DISCONTINUED] traMADol (ULTRAM) 50 MG tablet Take 1 tablet (50 mg total) by mouth every 6 (six) hours as needed.   No facility-administered encounter medications on file as of 09/27/2017.     Subjective ongoiing management of perimenopausal bleeding On provera  cyclically for minimal endometrial development Biopsy 2017 benign Continues to have occasional withdrawal bleeding light brown to pink No pain Past Medical History:  Diagnosis Date  . Anxiety 08/14/2017  . Cellulitis 12/22/2012   Rx doxy and refer to Dr. Hilda Lias may need hard ware removed  . Depression 12/30/2015  . Diabetes (HCC) 01/03/2016  . Elevated blood sugar   . Fatigue 12/28/2014  . Hypertension   . Irregular bleeding 12/30/2015  . Menopause 12/24/2013  . Obesity   . PMB (postmenopausal bleeding) 12/28/2014  . Restless leg   . Seasonal allergies   . Vitamin D deficiency 01/03/2016    Past Surgical History:  Procedure Laterality Date  . ANKLE SURGERY Right    screws placed    OB History    Gravida  2   Para  2   Term  2   Preterm      AB      Living  2     SAB      TAB      Ectopic      Multiple      Live Births  2           Allergies  Allergen Reactions  . Morphine And Related Nausea And Vomiting    Social History   Socioeconomic History  . Marital status: Divorced    Spouse name: Not on file  . Number of children: Not on file  . Years of education: Not  on file  . Highest education level: Not on file  Occupational History  . Not on file  Social Needs  . Financial resource strain: Not on file  . Food insecurity:    Worry: Not on file    Inability: Not on file  . Transportation needs:    Medical: Not on file    Non-medical: Not on file  Tobacco Use  . Smoking status: Never Smoker  . Smokeless tobacco: Never Used  Substance and Sexual Activity  . Alcohol use: No  . Drug use: No  . Sexual activity: Not Currently    Birth control/protection: None  Lifestyle  . Physical activity:    Days per week: Not on file    Minutes per session: Not on file  . Stress: Not on file  Relationships  . Social connections:    Talks on phone: Not on file    Gets together: Not on file    Attends religious service: Not on file    Active member of  club or organization: Not on file    Attends meetings of clubs or organizations: Not on file    Relationship status: Not on file  Other Topics Concern  . Not on file  Social History Narrative  . Not on file    Family History  Problem Relation Age of Onset  . COPD Mother   . Diabetes Mother   . Hyperlipidemia Mother   . Hypertension Mother   . Parkinson's disease Father   . Diabetes Brother   . Hyperlipidemia Brother   . Mitral valve prolapse Sister   . Diabetes Brother   . Hyperlipidemia Brother   . Diabetes Sister   . Hyperlipidemia Sister   . Hypertension Sister   . Heart disease Sister        before age 23    Medications:       Current Outpatient Medications:  .  aspirin 81 MG tablet, Take 81 mg by mouth daily., Disp: , Rfl:  .  Cholecalciferol (VITAMIN D3) 5000 units CAPS, Take by mouth daily., Disp: , Rfl:  .  hydrochlorothiazide (MICROZIDE) 12.5 MG capsule, TAKE ONE (1) CAPSULE EACH DAY, Disp: 90 capsule, Rfl: 4 .  HYDROcodone-acetaminophen (NORCO) 7.5-325 MG per tablet, Take 1 tablet by mouth 4 (four) times daily. PT said she averages about 21 tablets weekly, Disp: , Rfl:  .  lisinopril (PRINIVIL,ZESTRIL) 10 MG tablet, TAKE ONE (1) TABLET EACH DAY, Disp: 90 tablet, Rfl: 4 .  LORazepam (ATIVAN) 1 MG tablet, Take by mouth at bedtime. , Disp: , Rfl: 4 .  medroxyPROGESTERone (PROVERA) 10 MG tablet, Take 1 tablet (10 mg total) by mouth daily., Disp: 30 tablet, Rfl: 4 .  metFORMIN (GLUCOPHAGE) 500 MG tablet, TAKE 1 TABLET EVERY MORNING WITH BREAKFAST, Disp: 90 tablet, Rfl: 4 .  naproxen sodium (ANAPROX) 220 MG tablet, Take 220 mg by mouth as needed., Disp: , Rfl:  .  rOPINIRole (REQUIP) 0.25 MG tablet, Take 0.25 mg by mouth at bedtime. 4 tablets at bedtime, Disp: , Rfl:  .  amoxicillin-clavulanate (AUGMENTIN) 875-125 MG tablet, Take 1 tablet by mouth 2 (two) times daily., Disp: 20 tablet, Rfl: 0 .  benzonatate (TESSALON PERLES) 100 MG capsule, Take 1 capsule (100 mg total)  by mouth 3 (three) times daily as needed for cough., Disp: 20 capsule, Rfl: 0 .  venlafaxine (EFFEXOR) 37.5 MG tablet, Take 17.5 mg by mouth. bid, Disp: , Rfl: 1  Objective Blood pressure 122/60, height 5\' 3"  (1.6  m), weight 237 lb 8 oz (107.7 kg), last menstrual period 08/23/2017.  Gen WDWN female NAD  Pertinent ROS No burning with urination, frequency or urgency No nausea, vomiting or diarrhea Nor fever chills or other constitutional symptoms   Labs or studies No new    Impression Diagnoses this Encounter::   ICD-10-CM   1. Abnormal perimenopausal bleeding N92.4     Established relevant diagnosis(es):   Plan/Recommendations: Meds ordered this encounter  Medications  . medroxyPROGESTERone (PROVERA) 10 MG tablet    Sig: Take 1 tablet (10 mg total) by mouth daily.    Dispense:  30 tablet    Refill:  4    Labs or Scans Ordered: No orders of the defined types were placed in this encounter.   Management:: Continue provera 10 mg every 3 months until 1-2 years with no withdrawal bleeding  Follow up Return in about 1 year (around 09/27/2018) for Follow up, with Dr Despina HiddenEure.        Face to face time:  10 minutes  Greater than 50% of the visit time was spent in counseling and coordination of care with the patient.  The summary and outline of the counseling and care coordination is summarized in the note above.   All questions were answered.

## 2017-12-08 ENCOUNTER — Emergency Department (HOSPITAL_COMMUNITY)
Admission: EM | Admit: 2017-12-08 | Discharge: 2017-12-08 | Disposition: A | Discharge status: Home / Self Care | Payer: BLUE CROSS/BLUE SHIELD | Attending: Emergency Medicine | Admitting: Emergency Medicine

## 2017-12-08 ENCOUNTER — Encounter (HOSPITAL_COMMUNITY): Payer: Self-pay | Admitting: Emergency Medicine

## 2017-12-08 ENCOUNTER — Emergency Department (HOSPITAL_COMMUNITY): Payer: BLUE CROSS/BLUE SHIELD

## 2017-12-08 DIAGNOSIS — Z7984 Long term (current) use of oral hypoglycemic drugs: Secondary | ICD-10-CM | POA: Diagnosis not present

## 2017-12-08 DIAGNOSIS — J069 Acute upper respiratory infection, unspecified: Secondary | ICD-10-CM | POA: Diagnosis not present

## 2017-12-08 DIAGNOSIS — Z7982 Long term (current) use of aspirin: Secondary | ICD-10-CM | POA: Diagnosis not present

## 2017-12-08 DIAGNOSIS — E119 Type 2 diabetes mellitus without complications: Secondary | ICD-10-CM | POA: Diagnosis not present

## 2017-12-08 DIAGNOSIS — Z79899 Other long term (current) drug therapy: Secondary | ICD-10-CM | POA: Insufficient documentation

## 2017-12-08 DIAGNOSIS — I10 Essential (primary) hypertension: Secondary | ICD-10-CM | POA: Insufficient documentation

## 2017-12-08 DIAGNOSIS — R0981 Nasal congestion: Secondary | ICD-10-CM | POA: Diagnosis present

## 2017-12-08 LAB — I-STAT CHEM 8, ED
BUN: 14 mg/dL (ref 6–20)
CALCIUM ION: 1.16 mmol/L (ref 1.15–1.40)
CHLORIDE: 101 mmol/L (ref 101–111)
CREATININE: 0.6 mg/dL (ref 0.44–1.00)
Glucose, Bld: 113 mg/dL — ABNORMAL HIGH (ref 65–99)
HEMATOCRIT: 37 % (ref 36.0–46.0)
Hemoglobin: 12.6 g/dL (ref 12.0–15.0)
Potassium: 4.2 mmol/L (ref 3.5–5.1)
Sodium: 141 mmol/L (ref 135–145)
TCO2: 30 mmol/L (ref 22–32)

## 2017-12-08 LAB — URINALYSIS, ROUTINE W REFLEX MICROSCOPIC
BILIRUBIN URINE: NEGATIVE
Glucose, UA: NEGATIVE mg/dL
Ketones, ur: NEGATIVE mg/dL
LEUKOCYTES UA: NEGATIVE
Nitrite: NEGATIVE
PH: 5 (ref 5.0–8.0)
Protein, ur: NEGATIVE mg/dL
SPECIFIC GRAVITY, URINE: 1.023 (ref 1.005–1.030)

## 2017-12-08 NOTE — ED Triage Notes (Signed)
Pt reports vomiting on Friday with generalized body aches and back pain starting after that.  States she then developed cough and congestion.  Denies abd pain or current vomiting.  No diarrhea.

## 2017-12-08 NOTE — ED Provider Notes (Signed)
[facility] EMERGENCY DEPARTMENT Provider Note   CSN: [id number] Arrival date & time: 12/08/17  1191     History   Chief Complaint Chief Complaint  Patient presents with  . Generalized Body Aches    HPI ELYSIA Goodman is a 57 y.o. female.  HPI Pt presents with complaints of body aches, nasal congestion, fatigue.  Patient states her symptoms first started on Friday.  She started having nausea and vomiting.  The day after she started developing cough and congestion and the vomiting had resolved.  She has not had any trouble with abdominal pain.  She has not had any episodes of vomiting over the last couple of days.  She denies any diarrhea however she continues to feel fatigued and achy.  Last night she started having some pain in her lower back.  Patient is supposed to go back to work today and she still does not feel well.  No known fevers.  No shortness of breath.  No rashes. Past Medical History:  Diagnosis Date  . Anxiety 08/14/2017  . Cellulitis 12/22/2012   Rx doxy and refer to Dr. Hilda Lias may need hard ware removed  . Depression 12/30/2015  . Diabetes (HCC) 01/03/2016  . Elevated blood sugar   . Fatigue 12/28/2014  . Hypertension   . Irregular bleeding 12/30/2015  . Menopause 12/24/2013  . Obesity   . PMB (postmenopausal bleeding) 12/28/2014  . Restless leg   . Seasonal allergies   . Vitamin D deficiency 01/03/2016    Patient Active Problem List   Diagnosis Date Noted  . Vitamin D deficiency 01/03/2016  . Diabetes (HCC) 01/03/2016  . Depression 12/30/2015  . Irregular bleeding 12/30/2015  . PMB (postmenopausal bleeding) 12/28/2014  . Fatigue 12/28/2014  . Menopause 12/24/2013  . Swelling of limb 05/28/2013  . Leukocytosis, unspecified 03/20/2013  . Hypokalemia 03/20/2013  . Essential hypertension   . Cellulitis of right leg 03/19/2013  . Obesity 11/19/2012    Past Surgical History:  Procedure Laterality Date  . ANKLE SURGERY Right    screws placed      OB History    Gravida  2   Para  2   Term  2   Preterm      AB      Living  2     SAB      TAB      Ectopic      Multiple      Live Births  2            Home Medications    Prior to Admission medications   Medication Sig Start Date End Date Taking? Authorizing Provider  aspirin 81 MG tablet Take 81 mg by mouth daily.    [provider]  busPIRone (BUSPAR) 15 MG tablet Take 7.5 mg by mouth 3 (three) times daily. 06/24/17   [provider]  cetirizine (ZYRTEC) 10 MG tablet Take 10 mg by mouth as needed. Reported on 01/31/2016    [provider]  Cholecalciferol (VITAMIN D3) 5000 units CAPS Take by mouth daily.    [provider]  escitalopram (LEXAPRO) 20 MG tablet Take 20 mg by mouth daily.  12/28/16   [provider]  hydrochlorothiazide (MICROZIDE) 12.5 MG capsule TAKE ONE (1) CAPSULE EACH DAY 01/25/17   Cyril Mourning A, NP  HYDROcodone-acetaminophen (NORCO) 7.5-325 MG per tablet Take 1 tablet by mouth 4 (four) times daily. PT said she averages about 21 tablets weekly    [provider]  lisinopril (PRINIVIL,ZESTRIL) 10 MG tablet TAKE ONE (1) TABLET EACH DAY 01/25/17   Cyril Mourning A, NP  LORazepam (ATIVAN) 1 MG tablet Take by mouth at bedtime.  11/29/16   [provider]  medroxyPROGESTERone (PROVERA) 10 MG tablet Take 1 tablet (10 mg total) by mouth daily. 09/27/17   Lazaro Arms, MD  metFORMIN (GLUCOPHAGE) 500 MG tablet TAKE 1 TABLET EVERY MORNING WITH BREAKFAST 01/25/17   Cyril Mourning A, NP  naproxen sodium (ANAPROX) 220 MG tablet Take 220 mg by mouth as needed.    [provider]  rOPINIRole (REQUIP) 0.25 MG tablet Take 0.25 mg by mouth at bedtime. 4 tablets at bedtime    [provider]    Family History Family History  Problem Relation Age of Onset  . COPD Mother   . Diabetes Mother   . Hyperlipidemia Mother   . Hypertension Mother   . Parkinson's disease  Father   . Diabetes Brother   . Hyperlipidemia Brother   . Mitral valve prolapse Sister   . Diabetes Brother   . Hyperlipidemia Brother   . Diabetes Sister   . Hyperlipidemia Sister   . Hypertension Sister   . Heart disease Sister        before age 79    Social History Social History   Tobacco Use  . Smoking status: Never Smoker  . Smokeless tobacco: Never Used  Substance Use Topics  . Alcohol use: No  . Drug use: No     Allergies   Morphine and related   Review of Systems Review of Systems  All other systems reviewed and are negative.    Physical Exam Updated Vital Signs BP 133/84 (BP Location: Left Arm)   Pulse 74   Temp 97.6 F (36.4 C) (Oral)   Resp 16   Ht 1.6 m ( )   Wt 105.7 kg (233 lb)   LMP 08/23/2017 Comment: spotting  SpO2 100%   BMI 41.27 kg/m   Physical Exam  Constitutional: She appears well-developed and well-nourished. No distress.  HENT:  Head: Normocephalic and atraumatic.  Right Ear: External ear normal.  Left Ear: External ear normal.  Eyes: Conjunctivae are normal. Right eye exhibits no discharge. Left eye exhibits no discharge. No scleral icterus.  Neck: Neck supple. No tracheal deviation present.  Cardiovascular: Normal rate, regular rhythm and intact distal pulses.  Pulmonary/Chest: Effort normal and breath sounds normal. No stridor. No respiratory distress. She has no wheezes. She has no rales.  Abdominal: Soft. Bowel sounds are normal. She exhibits no distension. There is no tenderness. There is no rebound and no guarding.  Musculoskeletal: She exhibits no edema or tenderness.  Neurological: She is alert. She has normal strength. No cranial nerve deficit (no facial droop, extraocular movements intact, no slurred speech) or sensory deficit. She exhibits normal muscle tone. She displays no seizure activity. Coordination normal.  Skin: Skin is warm and dry. No rash noted.  Psychiatric: She has a normal mood and affect.  Nursing  note and vitals reviewed.    ED Treatments / Results  Labs (all labs ordered are listed, but only abnormal results are displayed) Labs Reviewed  URINALYSIS, ROUTINE W REFLEX MICROSCOPIC - Abnormal; Notable for the following components:      Result Value   Hgb urine dipstick SMALL (*)    Bacteria, UA FEW (*)    All other components within normal limits  I-STAT CHEM 8, ED - Abnormal; Notable for the following  components:   Glucose, Bld 113 (*)    All other components within normal limits    Radiology Dg Chest 2 View  Result Date: 12/08/2017 CLINICAL DATA:  Cough and body aches for 2 days. EXAM: CHEST - 2 VIEW COMPARISON:  None. FINDINGS: The heart size and mediastinal contours are within normal limits. Both lungs are clear. The visualized skeletal structures are unremarkable. IMPRESSION: Negative.  No active cardiopulmonary disease. Electronically Signed   By: Myles Rosenthal M.D.   On: 12/08/2017 08:23    Procedures Procedures (including critical care time)  Medications Ordered in ED Medications - No data to display   Initial Impression / Assessment and Plan / ED Course  I have reviewed the triage vital signs and the nursing notes.  Pertinent labs & imaging results that were available during my care of the patient were reviewed by me and considered in my medical decision making (see chart for details).   ED work-up is reassuring.  No signs of pneumonia.  No signs of urinary tract infection.  No evidence of anemia or any electrolyte abnormalities.  Suspect her symptoms are related to a viral infection.  Patient appears stable for discharge.  Follow-up with her primary doctor if not improving in the week  Final Clinical Impressions(s) / ED Diagnoses   Final diagnoses:  Upper respiratory tract infection, unspecified type    ED Discharge Orders    None       Linwood Dibbles, MD 12/08/17 [id number]

## 2017-12-08 NOTE — Discharge Instructions (Addendum)
Take over-the-counter medications as needed to help with the cough and congestion.  Take Tylenol or ibuprofen for body aches.  Follow-up with your doctor next week if your symptoms have not resolved

## 2017-12-31 ENCOUNTER — Other Ambulatory Visit: Payer: Self-pay | Admitting: Adult Health

## 2017-12-31 DIAGNOSIS — Z1231 Encounter for screening mammogram for malignant neoplasm of breast: Secondary | ICD-10-CM

## 2018-01-27 ENCOUNTER — Ambulatory Visit (HOSPITAL_COMMUNITY): Payer: BLUE CROSS/BLUE SHIELD

## 2018-01-27 ENCOUNTER — Other Ambulatory Visit: Payer: Self-pay | Admitting: Adult Health

## 2018-02-05 ENCOUNTER — Ambulatory Visit (HOSPITAL_COMMUNITY): Payer: BLUE CROSS/BLUE SHIELD

## 2018-02-21 ENCOUNTER — Ambulatory Visit (HOSPITAL_COMMUNITY): Payer: BLUE CROSS/BLUE SHIELD

## 2018-02-21 ENCOUNTER — Other Ambulatory Visit: Payer: Self-pay | Admitting: Adult Health

## 2018-03-06 ENCOUNTER — Encounter: Payer: Self-pay | Admitting: Family Medicine

## 2018-03-06 ENCOUNTER — Ambulatory Visit: Payer: BLUE CROSS/BLUE SHIELD | Admitting: Family Medicine

## 2018-03-06 VITALS — BP 129/83 | HR 95 | Temp 98.0°F | Ht 63.0 in | Wt 233.0 lb

## 2018-03-06 DIAGNOSIS — J0100 Acute maxillary sinusitis, unspecified: Secondary | ICD-10-CM | POA: Diagnosis not present

## 2018-03-06 DIAGNOSIS — R51 Headache: Secondary | ICD-10-CM | POA: Diagnosis not present

## 2018-03-06 DIAGNOSIS — R519 Headache, unspecified: Secondary | ICD-10-CM

## 2018-03-06 MED ORDER — METHYLPREDNISOLONE ACETATE 80 MG/ML IJ SUSP
40.0000 mg | Freq: Once | INTRAMUSCULAR | Status: AC
  Administered 2018-03-06: 40 mg via INTRAMUSCULAR

## 2018-03-06 MED ORDER — AMOXICILLIN-POT CLAVULANATE 875-125 MG PO TABS: 1.0000 | ORAL_TABLET | Freq: Two times a day (BID) | ORAL | 0 refills | Status: DC

## 2018-03-06 MED ORDER — BENZONATATE 100 MG PO CAPS: 100.0000 mg | ORAL_CAPSULE | Freq: Three times a day (TID) | ORAL | 0 refills | Status: DC | PRN

## 2018-03-06 NOTE — Patient Instructions (Signed)
You were given a dose of DepoMedrol here today to help with pain and inflammation.  You may see a rise in your blood sugars over the next couple of days.  I have ordered an anabiotic and a cough medication.  If your symptoms worsen or do not improve as expected, please call Dr. Sudie Bailey for an appointment.  - Get plenty of rest and drink plenty of fluids. - Try to breathe moist air. Use a cold mist humidifier. - Consume warm fluids (soup or tea) to provide relief for a stuffy nose and to loosen phlegm. - For nasal stuffiness, try saline nasal spray or a Neti Pot. Afrin nasal spray can also be used but this product should not be used longer than 3 days or it will cause rebound nasal stuffiness (worsening nasal congestion). - For sore throat pain relief: use chloraseptic spray, suck on throat lozenges, hard candy or popsicles; gargle with warm salt water (1/4 tsp. salt per 8 oz. of water); and eat soft, bland foods. - Eat a well-balanced diet. If you cannot, ensure you are getting enough nutrients by taking a daily multivitamin. - Avoid dairy products, as they can thicken phlegm. - Avoid alcohol, as it impairs your body's immune system.      Sinusitis, Adult Sinusitis is soreness and inflammation of your sinuses. Sinuses are hollow spaces in the bones around your face. They are located:  Around your eyes.  In the middle of your forehead.  Behind your nose.  In your cheekbones.  Your sinuses and nasal passages are lined with a stringy fluid (mucus). Mucus normally drains out of your sinuses. When your nasal tissues get inflamed or swollen, the mucus can get trapped or blocked so air cannot flow through your sinuses. This lets bacteria, viruses, and funguses grow, and that leads to infection. Follow these instructions at home: Medicines  Take, use, or apply over-the-counter and prescription medicines only as told by your doctor. These may include nasal sprays.  If you were prescribed an  antibiotic medicine, take it as told by your doctor. Do not stop taking the antibiotic even if you start to feel better. Hydrate and Humidify  Drink enough water to keep your pee (urine) clear or pale yellow.  Use a cool mist humidifier to keep the humidity level in your home above 50%.  Breathe in steam for 10-15 minutes, 3-4 times a day or as told by your doctor. You can do this in the bathroom while a hot shower is running.  Try not to spend time in cool or dry air. Rest  Rest as much as possible.  Sleep with your head raised (elevated).  Make sure to get enough sleep each night. General instructions  Put a warm, moist washcloth on your face 3-4 times a day or as told by your doctor. This will help with discomfort.  Wash your hands often with soap and water. If there is no soap and water, use hand sanitizer.  Do not smoke. Avoid being around people who are smoking (secondhand smoke).  Keep all follow-up visits as told by your doctor. This is important. Contact a doctor if:  You have a fever.  Your symptoms get worse.  Your symptoms do not get better within 10 days. Get help right away if:  You have a very bad headache.  You cannot stop throwing up (vomiting).  You have pain or swelling around your face or eyes.  You have trouble seeing.  You feel confused.  Your  neck is stiff.  You have trouble breathing. This information is not intended to replace advice given to you by your health care provider. Make sure you discuss any questions you have with your health care provider. Document Released: 01/16/2008 Document Revised: 03/25/2016 Document Reviewed: 05/25/2015 Elsevier Interactive Patient Education  [employer]2018 Elsevier Inc[employer]

## 2018-03-06 NOTE — Progress Notes (Signed)
Subjective: CC: Myalgia, headache PCP: Gareth MorganKnowlton, Steve, MD LOV:FIEPPIHPI:Allison Goodman is a 57 y.o. female presenting to clinic today for:  1. Myalgia/ Headache Patient reports onset of symptoms Tuesday.  She states that she has had a sinus headache that radiates from her cheeks to her head.  Reports nasal congestion and rhinorrhea.  She notes a productive cough.  No hemoptysis or shortness of breath.  No fevers.  She had a sick nephew with a viral URI recently.  Denies any neurologic symptoms including changes in vision, dizziness, weakness or numbness and tingling.  She states that symptoms are worse at night.  She is used Mucinex and Aleve but this did not help.  She reports her PCP is Dr. Sudie BaileyKnowlton but she is come here in the past for acute issues when his office is closed.  She has missed work since Tuesday secondary to illness.  Past medical history significant for type 2 diabetes that is controlled with metformin.   ROS: Per HPI  Allergies  Allergen Reactions  . Morphine And Related Nausea And Vomiting   Past Medical History:  Diagnosis Date  . Anxiety 08/14/2017  . Cellulitis 12/22/2012   Rx doxy and refer to Dr. Hilda LiasKeeling may need hard ware removed  . Depression 12/30/2015  . Diabetes (HCC) 01/03/2016  . Elevated blood sugar   . Fatigue 12/28/2014  . Hypertension   . Irregular bleeding 12/30/2015  . Menopause 12/24/2013  . Obesity   . PMB (postmenopausal bleeding) 12/28/2014  . Restless leg   . Seasonal allergies   . Vitamin D deficiency 01/03/2016    Current Outpatient Medications:  .  aspirin 81 MG tablet, Take 81 mg by mouth daily., Disp: , Rfl:  .  busPIRone (BUSPAR) 15 MG tablet, Take 7.5 mg by mouth 3 (three) times daily., Disp: , Rfl: 2 .  cetirizine (ZYRTEC) 10 MG tablet, Take 10 mg by mouth as needed. Reported on 01/31/2016, Disp: , Rfl:  .  Cholecalciferol (VITAMIN D3) 5000 units CAPS, Take by mouth daily., Disp: , Rfl:  .  escitalopram (LEXAPRO) 20 MG tablet, Take 20 mg  by mouth daily. , Disp: , Rfl: 10 .  hydrochlorothiazide (MICROZIDE) 12.5 MG capsule, TAKE ONE (1) CAPSULE EACH DAY, Disp: 90 capsule, Rfl: 4 .  HYDROcodone-acetaminophen (NORCO) 7.5-325 MG per tablet, Take 1 tablet by mouth 4 (four) times daily. PT said she averages about 21 tablets weekly, Disp: , Rfl:  .  lisinopril (PRINIVIL,ZESTRIL) 10 MG tablet, TAKE ONE (1) TABLET EACH DAY, Disp: 90 tablet, Rfl: 4 .  LORazepam (ATIVAN) 1 MG tablet, Take by mouth at bedtime. , Disp: , Rfl: 4 .  medroxyPROGESTERone (PROVERA) 10 MG tablet, Take 1 tablet (10 mg total) by mouth daily., Disp: 30 tablet, Rfl: 4 .  metFORMIN (GLUCOPHAGE) 500 MG tablet, TAKE 1 TABLET EVERY MORNING WITH BREAKFAST, Disp: 90 tablet, Rfl: 4 .  naproxen sodium (ANAPROX) 220 MG tablet, Take 220 mg by mouth as needed., Disp: , Rfl:  .  rOPINIRole (REQUIP) 0.25 MG tablet, Take 0.25 mg by mouth at bedtime. 4 tablets at bedtime, Disp: , Rfl:  Social History   Socioeconomic History  . Marital status: Divorced    Spouse name: Not on file  . Number of children: Not on file  . Years of education: Not on file  . Highest education level: Not on file  Occupational History  . Not on file  Social Needs  . Financial resource strain: Not on file  . Food  insecurity:    Worry: Not on file    Inability: Not on file  . Transportation needs:    Medical: Not on file    Non-medical: Not on file  Tobacco Use  . Smoking status: Never Smoker  . Smokeless tobacco: Never Used  Substance and Sexual Activity  . Alcohol use: No  . Drug use: No  . Sexual activity: Not Currently    Birth control/protection: None  Lifestyle  . Physical activity:    Days per week: Not on file    Minutes per session: Not on file  . Stress: Not on file  Relationships  . Social connections:    Talks on phone: Not on file    Gets together: Not on file    Attends religious service: Not on file    Active member of club or organization: Not on file    Attends meetings  of clubs or organizations: Not on file    Relationship status: Not on file  . Intimate partner violence:    Fear of current or ex partner: Not on file    Emotionally abused: Not on file    Physically abused: Not on file    Forced sexual activity: Not on file  Other Topics Concern  . Not on file  Social History Narrative  . Not on file   Family History  Problem Relation Age of Onset  . COPD Mother   . Diabetes Mother   . Hyperlipidemia Mother   . Hypertension Mother   . Parkinson's disease Father   . Diabetes Brother   . Hyperlipidemia Brother   . Mitral valve prolapse Sister   . Diabetes Brother   . Hyperlipidemia Brother   . Diabetes Sister   . Hyperlipidemia Sister   . Hypertension Sister   . Heart disease Sister        before age 29    Objective: Office vital signs reviewed. BP 129/83   Pulse 95   Temp 98 F (36.7 C) (Oral)   Ht 5\' 3"  (1.6 m)   Wt 233 lb (105.7 kg)   LMP 08/23/2017 Comment: spotting  SpO2 98%   BMI 41.27 kg/m   Physical Examination:  General: Awake, alert, nontoxic, No acute distress HEENT: +TTP to maxillary and frontal sinuses    Neck: No masses palpated. No lymphadenopathy    Ears: Tympanic membranes intact, normal light reflex, no erythema, no bulging    Eyes: PERRLA, extraocular membranes intact, sclera white    Nose: nasal turbinates moist, clear nasal discharge    Throat: moist mucus membranes, no erythema, no tonsillar exudate.  Airway is patent Cardio: regular rate and rhythm, S1S2 heard, no murmurs appreciated Pulm: clear to auscultation bilaterally, no wheezes, rhonchi or rales; normal work of breathing on room air Neuro: No focal neurologic deficits.  Assessment/ Plan: 57 y.o. female   1. Acute non-recurrent maxillary sinusitis Patient is afebrile and nontoxic.  Given severity of symptoms and worsening at night, will empirically treat as acute bacterial sinusitis with Augmentin p.o. twice daily.  Tessalon Perles were  prescribed for cough.  Depo-Medrol 40 IM provided for headache that is refractory to Aleve.  She had no neurologic abnormalities on exam.  I suspect the headache is related to sinus infection.  Home care instructions were reviewed with the patient.  Reasons for return and urgent/emergent evaluation discussed.  She is to follow-up with PCP for ongoing medical needs.  Work note provided excusing through Saturday. - methylPREDNISolone acetate (DEPO-MEDROL) injection  40 mg  2. Sinus headache - methylPREDNISolone acetate (DEPO-MEDROL) injection 40 mg   Meds ordered this encounter  Medications  . amoxicillin-clavulanate (AUGMENTIN) 875-125 MG tablet    Sig: Take 1 tablet by mouth 2 (two) times daily.    Dispense:  20 tablet    Refill:  0  . methylPREDNISolone acetate (DEPO-MEDROL) injection 40 mg  . benzonatate (TESSALON PERLES) 100 MG capsule    Sig: Take 1 capsule (100 mg total) by mouth 3 (three) times daily as needed for cough.    Dispense:  20 capsule    Refill:  0     Jamaal Bernasconi Hulen Skains, DO [facility] [phone]

## 2018-03-17 ENCOUNTER — Inpatient Hospital Stay (HOSPITAL_COMMUNITY): Admission: RE | Admit: 2018-03-17 | Payer: BLUE CROSS/BLUE SHIELD | Source: Ambulatory Visit

## 2018-03-17 ENCOUNTER — Other Ambulatory Visit: Payer: Self-pay | Admitting: Adult Health

## 2018-03-26 ENCOUNTER — Ambulatory Visit (HOSPITAL_COMMUNITY): Payer: BLUE CROSS/BLUE SHIELD

## 2018-04-18 ENCOUNTER — Other Ambulatory Visit: Payer: Self-pay

## 2018-04-18 ENCOUNTER — Encounter: Payer: Self-pay | Admitting: Adult Health

## 2018-04-18 ENCOUNTER — Ambulatory Visit (HOSPITAL_COMMUNITY)
Admission: RE | Admit: 2018-04-18 | Discharge: 2018-04-18 | Disposition: A | Discharge status: Home / Self Care | Payer: BLUE CROSS/BLUE SHIELD | Source: Ambulatory Visit | Attending: Adult Health | Admitting: Adult Health

## 2018-04-18 ENCOUNTER — Ambulatory Visit (HOSPITAL_COMMUNITY): Payer: BLUE CROSS/BLUE SHIELD

## 2018-04-18 ENCOUNTER — Ambulatory Visit (INDEPENDENT_AMBULATORY_CARE_PROVIDER_SITE_OTHER): Payer: BLUE CROSS/BLUE SHIELD | Admitting: Adult Health

## 2018-04-18 VITALS — BP 129/71 | HR 86 | Ht 63.0 in | Wt 233.0 lb

## 2018-04-18 DIAGNOSIS — Z1231 Encounter for screening mammogram for malignant neoplasm of breast: Secondary | ICD-10-CM | POA: Diagnosis present

## 2018-04-18 DIAGNOSIS — Z1212 Encounter for screening for malignant neoplasm of rectum: Secondary | ICD-10-CM

## 2018-04-18 DIAGNOSIS — Z1211 Encounter for screening for malignant neoplasm of colon: Secondary | ICD-10-CM

## 2018-04-18 DIAGNOSIS — Z01419 Encounter for gynecological examination (general) (routine) without abnormal findings: Secondary | ICD-10-CM | POA: Diagnosis not present

## 2018-04-18 LAB — HEMOCCULT GUIAC POC 1CARD (OFFICE): Fecal Occult Blood, POC: NEGATIVE

## 2018-04-18 NOTE — Progress Notes (Addendum)
Patient ID: Allison Goodman, female   DOB: [birth year], 57 y.o.   MRN: [id number] History of Present Illness: Allison Goodman is a 57 year old white female,PM in for well woman gyn exam,she had a normal pap with negative HPV 12/20/15. PCP is Dr Sudie Bailey.   Current Medications, Allergies, Past Medical History, Past Surgical History, Family History and Social History were reviewed in [employer] record.     Review of Systems: Patient denies any headaches, hearing loss, fatigue, blurred vision, shortness of breath, chest pain, abdominal pain, problems with bowel movements, urination, or intercourse. No joint pain or mood swings. She had labs this am, for PCP. Still has bleeding after provera.   Physical Exam:BP 129/71 (BP Location: Right Arm, Patient Position: Sitting, Cuff Size: Normal)   Pulse 86   Ht 5\' 3"  (1.6 m)   Wt 233 lb (105.7 kg)   LMP 02/21/2018   BMI 41.27 kg/m  General:  Well developed, well nourished, no acute distress Skin:  Warm and dry Neck:  Midline trachea, normal thyroid, good ROM, no lymphadenopathy Lungs; Clear to auscultation bilaterally Breast:  No dominant palpable mass, retraction, or nipple discharge Cardiovascular: Regular rate and rhythm Abdomen:  Soft, non tender, no hepatosplenomegaly Pelvic:  External genitalia is normal in appearance, no lesions.  The vagina is normal in appearance. Urethra has no lesions or masses. The cervix is bulbous.  Uterus is felt to be normal size, shape, and contour.  No adnexal masses or tenderness noted.Bladder is non tender, no masses felt. Rectal: Good sphincter tone, no polyps, or hemorrhoids felt.  Hemoccult negative. Extremities/musculoskeletal:  No swelling or varicosities noted, no clubbing or cyanosis Psych:  No mood changes, alert and cooperative,seems happy PHQ 9 score 8, denies being suicidal, and is on effexor now.  Examination chaperoned by Francene Finders.   Impression: 1. Well woman exam with  routine gynecological exam   2. Encounter for colorectal cancer screening       Plan: Physical and pap in 1 year Mammogram today and yearly Labs with PCP Continue provera Colonoscopy advised or cologuard

## 2018-08-28 ENCOUNTER — Ambulatory Visit: Payer: BLUE CROSS/BLUE SHIELD | Admitting: Obstetrics & Gynecology

## 2018-08-28 ENCOUNTER — Telehealth: Payer: Self-pay | Admitting: Adult Health

## 2018-08-28 ENCOUNTER — Encounter: Payer: Self-pay | Admitting: Obstetrics & Gynecology

## 2018-08-28 ENCOUNTER — Other Ambulatory Visit: Payer: Self-pay

## 2018-08-28 VITALS — BP 162/83 | HR 89 | Ht 63.0 in | Wt 228.0 lb

## 2018-08-28 DIAGNOSIS — N924 Excessive bleeding in the premenopausal period: Secondary | ICD-10-CM

## 2018-08-28 MED ORDER — MEGESTROL ACETATE 40 MG PO TABS: ORAL_TABLET | ORAL | 3 refills | Status: DC

## 2018-08-28 NOTE — Telephone Encounter (Signed)
Patient called stating that she would like a call back from Jennifer. Pt did not state the reason why. Please contact pt 

## 2018-08-28 NOTE — Progress Notes (Signed)
Chief Complaint  Patient presents with  . discuss medication      58 y.o. [id number] No LMP recorded. (Menstrual status: Perimenopausal). The current method of family planning is perimenopausal.  Outpatient Encounter Medications as of 08/28/2018  Medication Sig  . aspirin 81 MG tablet Take 81 mg by mouth daily.  . Cholecalciferol (VITAMIN D3) 5000 units CAPS Take by mouth daily.  . hydrochlorothiazide (MICROZIDE) 12.5 MG capsule TAKE ONE (1) CAPSULE EACH DAY  . HYDROcodone-acetaminophen (NORCO) 10-325 MG tablet Take 1 tablet by mouth 4 (four) times daily. PT said she averages about 21 tablets weekly  . lisinopril (PRINIVIL,ZESTRIL) 10 MG tablet TAKE ONE (1) TABLET EACH DAY  . LORazepam (ATIVAN) 1 MG tablet Take by mouth at bedtime.   . medroxyPROGESTERone (PROVERA) 10 MG tablet Take 1 tablet (10 mg total) by mouth daily.  . metFORMIN (GLUCOPHAGE) 500 MG tablet TAKE 1 TABLET EVERY MORNING WITH BREAKFAST  . potassium chloride (K-DUR) 10 MEQ tablet   . rOPINIRole (REQUIP) 2 MG tablet Take 2 mg by mouth at bedtime. 4 tablets at bedtime   . venlafaxine (EFFEXOR) 75 MG tablet Take 75 mg by mouth 3 (three) times daily with meals. bid  . megestrol (MEGACE) 40 MG tablet 3 tablets a day for 5 days, 2 tablets a day for 5 days then 1 tablet daily   No facility-administered encounter medications on file as of 08/28/2018.     Subjective Pt has been bleeding heavy since 08/23/2018 She has been taking cyclical provera 10 mg daily for 10 days every 3 months with very little to scant dark bleeding with no real pain or problems However she has been habving a lot of pain heavy bleeding emotional with this episode and she has been on this regimen for over 2 years Never had this happen before Past Medical History:  Diagnosis Date  . Anxiety 08/14/2017  . Cellulitis 12/22/2012   Rx doxy and refer to Dr. Hilda Lias may need hard ware removed  . Depression 12/30/2015  . Diabetes (HCC) 01/03/2016  .  Elevated blood sugar   . Fatigue 12/28/2014  . Hypertension   . Irregular bleeding 12/30/2015  . Menopause 12/24/2013  . Obesity   . PMB (postmenopausal bleeding) 12/28/2014  . Restless leg   . Seasonal allergies   . Vitamin D deficiency 01/03/2016    Past Surgical History:  Procedure Laterality Date  . ANKLE SURGERY Right    screws placed    OB History    Gravida  2   Para  2   Term  2   Preterm      AB      Living  2     SAB      TAB      Ectopic      Multiple      Live Births  2           Allergies  Allergen Reactions  . Morphine And Related Nausea And Vomiting    Social History   Socioeconomic History  . Marital status: Divorced    Spouse name: Not on file  . Number of children: Not on file  . Years of education: Not on file  . Highest education level: Not on file  Occupational History  . Not on file  Social Needs  . Financial resource strain: Not on file  . Food insecurity:    Worry: Not on file    Inability: Not on file  .  Transportation needs:    Medical: Not on file    Non-medical: Not on file  Tobacco Use  . Smoking status: Never Smoker  . Smokeless tobacco: Never Used  Substance and Sexual Activity  . Alcohol use: No  . Drug use: No  . Sexual activity: Yes    Birth control/protection: None  Lifestyle  . Physical activity:    Days per week: Not on file    Minutes per session: Not on file  . Stress: Not on file  Relationships  . Social connections:    Talks on phone: Not on file    Gets together: Not on file    Attends religious service: Not on file    Active member of club or organization: Not on file    Attends meetings of clubs or organizations: Not on file    Relationship status: Not on file  Other Topics Concern  . Not on file  Social History Narrative  . Not on file    Family History  Problem Relation Age of Onset  . COPD Mother   . Diabetes Mother   . Hyperlipidemia Mother   . Hypertension Mother   .  Parkinson's disease Father   . Diabetes Brother   . Hyperlipidemia Brother   . Mitral valve prolapse Sister   . Diabetes Brother   . Hyperlipidemia Brother   . Diabetes Sister   . Hyperlipidemia Sister   . Hypertension Sister   . Heart disease Sister        before age 56    Medications:       Current Outpatient Medications:  .  aspirin 81 MG tablet, Take 81 mg by mouth daily., Disp: , Rfl:  .  Cholecalciferol (VITAMIN D3) 5000 units CAPS, Take by mouth daily., Disp: , Rfl:  .  hydrochlorothiazide (MICROZIDE) 12.5 MG capsule, TAKE ONE (1) CAPSULE EACH DAY, Disp: 90 capsule, Rfl: 4 .  HYDROcodone-acetaminophen (NORCO) 10-325 MG tablet, Take 1 tablet by mouth 4 (four) times daily. PT said she averages about 21 tablets weekly, Disp: , Rfl:  .  lisinopril (PRINIVIL,ZESTRIL) 10 MG tablet, TAKE ONE (1) TABLET EACH DAY, Disp: 90 tablet, Rfl: 4 .  LORazepam (ATIVAN) 1 MG tablet, Take by mouth at bedtime. , Disp: , Rfl: 4 .  medroxyPROGESTERone (PROVERA) 10 MG tablet, Take 1 tablet (10 mg total) by mouth daily., Disp: 30 tablet, Rfl: 4 .  metFORMIN (GLUCOPHAGE) 500 MG tablet, TAKE 1 TABLET EVERY MORNING WITH BREAKFAST, Disp: 90 tablet, Rfl: 4 .  potassium chloride (K-DUR) 10 MEQ tablet, , Disp: , Rfl:  .  rOPINIRole (REQUIP) 2 MG tablet, Take 2 mg by mouth at bedtime. 4 tablets at bedtime , Disp: , Rfl:  .  venlafaxine (EFFEXOR) 75 MG tablet, Take 75 mg by mouth 3 (three) times daily with meals. bid, Disp: , Rfl: 1 .  megestrol (MEGACE) 40 MG tablet, 3 tablets a day for 5 days, 2 tablets a day for 5 days then 1 tablet daily, Disp: 45 tablet, Rfl: 3  Objective Blood pressure (!) 162/83, pulse 89, height 5\' 3"  (1.6 m), weight 228 lb (103.4 kg).  Gen mild distress  Pertinent ROS No burning with urination, frequency or urgency No nausea, vomiting or diarrhea Nor fever chills or other constitutional symptoms   Labs or studies     Impression Diagnoses this Encounter::   ICD-10-CM   1.  Abnormal perimenopausal bleeding N92.4     Established relevant diagnosis(es):   Plan/Recommendations: Meds ordered  this encounter  Medications  . megestrol (MEGACE) 40 MG tablet    Sig: 3 tablets a day for 5 days, 2 tablets a day for 5 days then 1 tablet daily    Dispense:  45 tablet    Refill:  3    Labs or Scans Ordered: No orders of the defined types were placed in this encounter.   Management:: >try to stop her bleeding/pain etc with megestrol >see back in a month for evaluation of her response and may need to come up with a different plan to manage this peri menopausal epoch of her life  Follow up Return in about 1 month (around 09/28/2018) for Follow up, with Dr Despina Hidden.        Face to face time:  15 minutes  Greater than 50% of the visit time was spent in counseling and coordination of care with the patient.  The summary and outline of the counseling and care coordination is summarized in the note above.   All questions were answered.

## 2018-08-28 NOTE — Telephone Encounter (Signed)
Allison Goodman is teary, bleeding heavier and very emotional and has missed work, she wants to be seen today.will work in with Dr Despina Hidden

## 2018-10-03 ENCOUNTER — Ambulatory Visit: Payer: Self-pay | Admitting: Obstetrics & Gynecology

## 2018-10-20 ENCOUNTER — Encounter: Payer: Self-pay | Admitting: Obstetrics & Gynecology

## 2018-10-20 ENCOUNTER — Ambulatory Visit: Payer: BLUE CROSS/BLUE SHIELD | Admitting: Obstetrics & Gynecology

## 2018-10-20 VITALS — BP 140/92 | HR 99 | Ht 63.0 in | Wt 225.0 lb

## 2018-10-20 DIAGNOSIS — N924 Excessive bleeding in the premenopausal period: Secondary | ICD-10-CM

## 2018-10-20 MED ORDER — MEGESTROL ACETATE 20 MG PO TABS: 20.0000 mg | ORAL_TABLET | Freq: Every day | ORAL | 6 refills | Status: DC

## 2018-10-20 NOTE — Progress Notes (Signed)
Chief Complaint  Patient presents with  . Follow-up      58 y.o. [id number] No LMP recorded. (Menstrual status: Perimenopausal). The current method of family planning is none.  Outpatient Encounter Medications as of 10/20/2018  Medication Sig  . aspirin 81 MG tablet Take 81 mg by mouth daily.  . Cholecalciferol (VITAMIN D3) 5000 units CAPS Take by mouth daily.  . hydrochlorothiazide (MICROZIDE) 12.5 MG capsule TAKE ONE (1) CAPSULE EACH DAY  . HYDROcodone-acetaminophen (NORCO) 10-325 MG tablet Take 1 tablet by mouth 4 (four) times daily. PT said she averages about 21 tablets weekly  . lisinopril (PRINIVIL,ZESTRIL) 10 MG tablet TAKE ONE (1) TABLET EACH DAY  . LORazepam (ATIVAN) 1 MG tablet Take by mouth at bedtime.   . metFORMIN (GLUCOPHAGE) 500 MG tablet TAKE 1 TABLET EVERY MORNING WITH BREAKFAST  . potassium chloride (K-DUR) 10 MEQ tablet   . rOPINIRole (REQUIP) 2 MG tablet Take 2 mg by mouth at bedtime. 4 tablets at bedtime   . venlafaxine (EFFEXOR) 75 MG tablet Take 75 mg by mouth 3 (three) times daily with meals. bid  . [DISCONTINUED] megestrol (MEGACE) 40 MG tablet 3 tablets a day for 5 days, 2 tablets a day for 5 days then 1 tablet daily  . megestrol (MEGACE) 20 MG tablet Take 1 tablet (20 mg total) by mouth daily.  . [DISCONTINUED] medroxyPROGESTERone (PROVERA) 10 MG tablet Take 1 tablet (10 mg total) by mouth daily.   No facility-administered encounter medications on file as of 10/20/2018.     Subjective Pt is in for follow up of her appointment form 1/202 She stopped bleeding almost immediately and is doing well on the regimen No emotional issues either Past Medical History:  Diagnosis Date  . Anxiety 08/14/2017  . Cellulitis 12/22/2012   Rx doxy and refer to Dr. Hilda Lias may need hard ware removed  . Depression 12/30/2015  . Diabetes (HCC) 01/03/2016  . Elevated blood sugar   . Fatigue 12/28/2014  . Hypertension   . Irregular bleeding 12/30/2015  . Menopause 12/24/2013   . Obesity   . PMB (postmenopausal bleeding) 12/28/2014  . Restless leg   . Seasonal allergies   . Vitamin D deficiency 01/03/2016    Past Surgical History:  Procedure Laterality Date  . ANKLE SURGERY Right    screws placed    OB History    Gravida  2   Para  2   Term  2   Preterm      AB      Living  2     SAB      TAB      Ectopic      Multiple      Live Births  2           Allergies  Allergen Reactions  . Morphine And Related Nausea And Vomiting    Social History   Socioeconomic History  . Marital status: Divorced    Spouse name: Not on file  . Number of children: Not on file  . Years of education: Not on file  . Highest education level: Not on file  Occupational History  . Not on file  Social Needs  . Financial resource strain: Not on file  . Food insecurity:    Worry: Not on file    Inability: Not on file  . Transportation needs:    Medical: Not on file    Non-medical: Not on file  Tobacco Use  .  Smoking status: Never Smoker  . Smokeless tobacco: Never Used  Substance and Sexual Activity  . Alcohol use: No  . Drug use: No  . Sexual activity: Yes    Birth control/protection: None  Lifestyle  . Physical activity:    Days per week: Not on file    Minutes per session: Not on file  . Stress: Not on file  Relationships  . Social connections:    Talks on phone: Not on file    Gets together: Not on file    Attends religious service: Not on file    Active member of club or organization: Not on file    Attends meetings of clubs or organizations: Not on file    Relationship status: Not on file  Other Topics Concern  . Not on file  Social History Narrative  . Not on file    Family History  Problem Relation Age of Onset  . COPD Mother   . Diabetes Mother   . Hyperlipidemia Mother   . Hypertension Mother   . Parkinson's disease Father   . Diabetes Brother   . Hyperlipidemia Brother   . Mitral valve prolapse Sister   .  Diabetes Brother   . Hyperlipidemia Brother   . Diabetes Sister   . Hyperlipidemia Sister   . Hypertension Sister   . Heart disease Sister        before age 62    Medications:       Current Outpatient Medications:  .  aspirin 81 MG tablet, Take 81 mg by mouth daily., Disp: , Rfl:  .  Cholecalciferol (VITAMIN D3) 5000 units CAPS, Take by mouth daily., Disp: , Rfl:  .  hydrochlorothiazide (MICROZIDE) 12.5 MG capsule, TAKE ONE (1) CAPSULE EACH DAY, Disp: 90 capsule, Rfl: 4 .  HYDROcodone-acetaminophen (NORCO) 10-325 MG tablet, Take 1 tablet by mouth 4 (four) times daily. PT said she averages about 21 tablets weekly, Disp: , Rfl:  .  lisinopril (PRINIVIL,ZESTRIL) 10 MG tablet, TAKE ONE (1) TABLET EACH DAY, Disp: 90 tablet, Rfl: 4 .  LORazepam (ATIVAN) 1 MG tablet, Take by mouth at bedtime. , Disp: , Rfl: 4 .  metFORMIN (GLUCOPHAGE) 500 MG tablet, TAKE 1 TABLET EVERY MORNING WITH BREAKFAST, Disp: 90 tablet, Rfl: 4 .  potassium chloride (K-DUR) 10 MEQ tablet, , Disp: , Rfl:  .  rOPINIRole (REQUIP) 2 MG tablet, Take 2 mg by mouth at bedtime. 4 tablets at bedtime , Disp: , Rfl:  .  venlafaxine (EFFEXOR) 75 MG tablet, Take 75 mg by mouth 3 (three) times daily with meals. bid, Disp: , Rfl: 1 .  megestrol (MEGACE) 20 MG tablet, Take 1 tablet (20 mg total) by mouth daily., Disp: 30 tablet, Rfl: 6  Objective Blood pressure (!) 140/92, pulse 99, height 5\' 3"  (1.6 m), weight 225 lb (102.1 kg).  Gen WDWN NAD  Pertinent ROS No burning with urination, frequency or urgency No nausea, vomiting or diarrhea Nor fever chills or other constitutional symptoms   Labs or studies     Impression Diagnoses this Encounter::   ICD-10-CM   1. Abnormal perimenopausal bleeding N92.4     Established relevant diagnosis(es):   Plan/Recommendations: Meds ordered this encounter  Medications  . megestrol (MEGACE) 20 MG tablet    Sig: Take 1 tablet (20 mg total) by mouth daily.    Dispense:  30 tablet     Refill:  6    Labs or Scans Ordered: No orders of the defined types were  placed in this encounter.   Management:: >stop current megestrol for 1 week >start new prescription of megestrol, 20 mg daily, and see me back in 3 months, do not stop megestrol til I see her >call if her bleeding starts after her off week of megestrol  Follow up Return in about 3 months (around 01/20/2019) for Follow up, with Dr Despina Hidden.        Face to face time:  10 minutes  Greater than 50% of the visit time was spent in counseling and coordination of care with the patient.  The summary and outline of the counseling and care coordination is summarized in the note above.   All questions were answered.

## 2018-11-25 ENCOUNTER — Telehealth: Payer: Self-pay | Admitting: Obstetrics & Gynecology

## 2018-11-25 NOTE — Telephone Encounter (Signed)
Pt states that she has started spotting/bleeding again while taking megestrol (MEGACE) 20 MG tablet. She states it has been daily since starting the 20 MG. She would like a call from the nurse to discuss.

## 2018-11-26 NOTE — Telephone Encounter (Signed)
Pt started megace 20 mg 3/17 and has been spotting brown to black ever since, so take 40 mg megace for about 5 days and call me back

## 2018-12-02 NOTE — Telephone Encounter (Signed)
Pt requesting a call from [facility] regarding medication.

## 2018-12-10 ENCOUNTER — Telehealth: Payer: Self-pay | Admitting: Adult Health

## 2018-12-10 NOTE — Telephone Encounter (Signed)
Patient called stating that she would like a call back from [place], Patient states that Victorino Dike has change the dosage on a medication and has question regarding that medication. Please contact pt

## 2018-12-10 NOTE — Telephone Encounter (Addendum)
Spoke with pt. Pt is now on Megace 40 mg. Bleeding has completely stopped. Pt was having some spotting when taking the 20 mg. Does she continue taking the 40 mg or try and go back to the 20 mg? Pt has appt in June with Dr. Despina Hidden. Please advise. Thanks!! JSY

## 2018-12-11 NOTE — Telephone Encounter (Signed)
Stay on the 40 mg then, plan to see patient in June

## 2018-12-11 NOTE — Telephone Encounter (Signed)
mychart message to patient. 

## 2019-01-23 ENCOUNTER — Ambulatory Visit: Payer: BLUE CROSS/BLUE SHIELD | Admitting: Obstetrics & Gynecology

## 2019-01-28 ENCOUNTER — Encounter: Payer: Self-pay | Admitting: *Deleted

## 2019-01-29 ENCOUNTER — Ambulatory Visit: Payer: BC Managed Care – PPO | Admitting: Obstetrics & Gynecology

## 2019-02-09 ENCOUNTER — Other Ambulatory Visit: Payer: Self-pay

## 2019-02-09 ENCOUNTER — Encounter: Payer: Self-pay | Admitting: Obstetrics & Gynecology

## 2019-02-09 ENCOUNTER — Ambulatory Visit (INDEPENDENT_AMBULATORY_CARE_PROVIDER_SITE_OTHER): Payer: BC Managed Care – PPO | Admitting: Obstetrics & Gynecology

## 2019-02-09 VITALS — BP 141/80 | HR 93 | Ht 63.0 in | Wt 245.0 lb

## 2019-02-09 DIAGNOSIS — N924 Excessive bleeding in the premenopausal period: Secondary | ICD-10-CM

## 2019-02-09 NOTE — Progress Notes (Signed)
Chief Complaint  Patient presents with  . Follow-up    taking megace/ having swelling ankles      58 y.o. [id number] No LMP recorded. (Menstrual status: Perimenopausal). The current method of family planning is perimenopausal.  Outpatient Encounter Medications as of 02/09/2019  Medication Sig  . aspirin 81 MG tablet Take 81 mg by mouth daily.  . Cholecalciferol (VITAMIN D3) 5000 units CAPS Take by mouth daily.  . hydrochlorothiazide (MICROZIDE) 12.5 MG capsule TAKE ONE (1) CAPSULE EACH DAY  . HYDROcodone-acetaminophen (NORCO) 10-325 MG tablet Take 1 tablet by mouth 4 (four) times daily. PT said she averages about 21 tablets weekly  . lisinopril (PRINIVIL,ZESTRIL) 10 MG tablet TAKE ONE (1) TABLET EACH DAY  . LORazepam (ATIVAN) 1 MG tablet Take by mouth at bedtime.   . metFORMIN (GLUCOPHAGE) 500 MG tablet TAKE 1 TABLET EVERY MORNING WITH BREAKFAST  . potassium chloride (K-DUR) 10 MEQ tablet   . rOPINIRole (REQUIP) 2 MG tablet Take 2 mg by mouth at bedtime. 4 tablets at bedtime   . venlafaxine (EFFEXOR) 75 MG tablet Take 75 mg by mouth 3 (three) times daily with meals. bid  . megestrol (MEGACE) 20 MG tablet Take 1 tablet (20 mg total) by mouth daily.   No facility-administered encounter medications on file as of 02/09/2019.     Subjective Pt is on chronic megestrol which is the only thing that has been successful in managing her perimenopausal bleeding None on 40 mg daily Wants to continue Past Medical History:  Diagnosis Date  . Anxiety 08/14/2017  . Cellulitis 12/22/2012   Rx doxy and refer to Dr. Hilda LiasKeeling may need hard ware removed  . Depression 12/30/2015  . Diabetes (HCC) 01/03/2016  . Elevated blood sugar   . Fatigue 12/28/2014  . Hypertension   . Irregular bleeding 12/30/2015  . Menopause 12/24/2013  . Obesity   . PMB (postmenopausal bleeding) 12/28/2014  . Restless leg   . Seasonal allergies   . Vitamin D deficiency 01/03/2016    Past Surgical History:  Procedure  Laterality Date  . ANKLE SURGERY Right    screws placed    OB History    Gravida  2   Para  2   Term  2   Preterm      AB      Living  2     SAB      TAB      Ectopic      Multiple      Live Births  2           Allergies  Allergen Reactions  . Morphine And Related Nausea And Vomiting    Social History   Socioeconomic History  . Marital status: Divorced    Spouse name: Not on file  . Number of children: 2  . Years of education: Not on file  . Highest education level: Not on file  Occupational History  . Not on file  Social Needs  . Financial resource strain: Not on file  . Food insecurity    Worry: Not on file    Inability: Not on file  . Transportation needs    Medical: Not on file    Non-medical: Not on file  Tobacco Use  . Smoking status: Never Smoker  . Smokeless tobacco: Never Used  Substance and Sexual Activity  . Alcohol use: No  . Drug use: No  . Sexual activity: Yes    Birth control/protection: None  Lifestyle  . Physical activity    Days per week: Not on file    Minutes per session: Not on file  . Stress: Not on file  Relationships  . Social Musicianconnections    Talks on phone: Not on file    Gets together: Not on file    Attends religious service: Not on file    Active member of club or organization: Not on file    Attends meetings of clubs or organizations: Not on file    Relationship status: Not on file  Other Topics Concern  . Not on file  Social History Narrative  . Not on file    Family History  Problem Relation Age of Onset  . COPD Mother   . Diabetes Mother   . Hyperlipidemia Mother   . Hypertension Mother   . Parkinson's disease Father   . Diabetes Brother   . Hyperlipidemia Brother   . Mitral valve prolapse Sister   . Diabetes Brother   . Hyperlipidemia Brother   . Diabetes Sister   . Hyperlipidemia Sister   . Hypertension Sister   . Heart disease Sister        before age 56    Medications:        Current Outpatient Medications:  .  aspirin 81 MG tablet, Take 81 mg by mouth daily., Disp: , Rfl:  .  Cholecalciferol (VITAMIN D3) 5000 units CAPS, Take by mouth daily., Disp: , Rfl:  .  hydrochlorothiazide (MICROZIDE) 12.5 MG capsule, TAKE ONE (1) CAPSULE EACH DAY, Disp: 90 capsule, Rfl: 4 .  HYDROcodone-acetaminophen (NORCO) 10-325 MG tablet, Take 1 tablet by mouth 4 (four) times daily. PT said she averages about 21 tablets weekly, Disp: , Rfl:  .  lisinopril (PRINIVIL,ZESTRIL) 10 MG tablet, TAKE ONE (1) TABLET EACH DAY, Disp: 90 tablet, Rfl: 4 .  LORazepam (ATIVAN) 1 MG tablet, Take by mouth at bedtime. , Disp: , Rfl: 4 .  metFORMIN (GLUCOPHAGE) 500 MG tablet, TAKE 1 TABLET EVERY MORNING WITH BREAKFAST, Disp: 90 tablet, Rfl: 4 .  potassium chloride (K-DUR) 10 MEQ tablet, , Disp: , Rfl:  .  rOPINIRole (REQUIP) 2 MG tablet, Take 2 mg by mouth at bedtime. 4 tablets at bedtime , Disp: , Rfl:  .  venlafaxine (EFFEXOR) 75 MG tablet, Take 75 mg by mouth 3 (three) times daily with meals. bid, Disp: , Rfl: 1 .  megestrol (MEGACE) 20 MG tablet, Take 1 tablet (20 mg total) by mouth daily., Disp: 30 tablet, Rfl: 6  Objective Blood pressure (!) 141/80, pulse 93, height 5\' 3"  (1.6 m), weight 245 lb (111.1 kg).  Gen WDWN NAD  Pertinent ROS No burning with urination, frequency or urgency No nausea, vomiting or diarrhea Nor fever chills or other constitutional symptoms   Labs or studies     Impression Diagnoses this Encounter::   ICD-10-CM   1. Abnormal perimenopausal bleeding  N92.4     Established relevant diagnosis(es):   Plan/Recommendations: No orders of the defined types were placed in this encounter.   Labs or Scans Ordered: No orders of the defined types were placed in this encounter.   Management:: >continue megestrol daily for 6 months >think about cyclical therapy again at that time  Follow up Return in about 6 months (around 08/11/2019) for Follow up, with Dr Despina HiddenEure.         Face to face time:  15 minutes  Greater than 50% of the visit time was spent in counseling and coordination  of care with the patient.  The summary and outline of the counseling and care coordination is summarized in the note above.   All questions were answered.

## 2019-02-16 ENCOUNTER — Other Ambulatory Visit: Payer: Self-pay | Admitting: Obstetrics & Gynecology

## 2019-04-13 ENCOUNTER — Other Ambulatory Visit (HOSPITAL_COMMUNITY): Payer: Self-pay | Admitting: Adult Health

## 2019-04-13 DIAGNOSIS — Z1231 Encounter for screening mammogram for malignant neoplasm of breast: Secondary | ICD-10-CM

## 2019-04-22 ENCOUNTER — Ambulatory Visit (HOSPITAL_COMMUNITY): Payer: BC Managed Care – PPO

## 2019-04-30 ENCOUNTER — Ambulatory Visit (HOSPITAL_COMMUNITY): Payer: BC Managed Care – PPO

## 2019-05-13 ENCOUNTER — Other Ambulatory Visit (HOSPITAL_COMMUNITY): Payer: Self-pay | Admitting: Internal Medicine

## 2019-05-13 ENCOUNTER — Other Ambulatory Visit (HOSPITAL_COMMUNITY): Payer: Self-pay | Admitting: Podiatry

## 2019-05-13 DIAGNOSIS — S93622A Sprain of tarsometatarsal ligament of left foot, initial encounter: Secondary | ICD-10-CM

## 2019-05-21 ENCOUNTER — Other Ambulatory Visit: Payer: Self-pay

## 2019-05-21 ENCOUNTER — Ambulatory Visit (HOSPITAL_COMMUNITY)
Admission: RE | Admit: 2019-05-21 | Discharge: 2019-05-21 | Disposition: A | Discharge status: Home / Self Care | Payer: BC Managed Care – PPO | Source: Ambulatory Visit | Attending: Podiatry | Admitting: Podiatry

## 2019-05-21 ENCOUNTER — Ambulatory Visit (HOSPITAL_COMMUNITY)
Admission: RE | Admit: 2019-05-21 | Discharge: 2019-05-21 | Disposition: A | Discharge status: Home / Self Care | Payer: BC Managed Care – PPO | Source: Ambulatory Visit | Attending: Adult Health | Admitting: Adult Health

## 2019-05-21 DIAGNOSIS — Z1231 Encounter for screening mammogram for malignant neoplasm of breast: Secondary | ICD-10-CM | POA: Insufficient documentation

## 2019-05-21 DIAGNOSIS — S93622A Sprain of tarsometatarsal ligament of left foot, initial encounter: Secondary | ICD-10-CM | POA: Insufficient documentation

## 2019-05-21 DIAGNOSIS — Z20822 Contact with and (suspected) exposure to covid-19: Secondary | ICD-10-CM

## 2019-05-21 MED ORDER — GADOBUTROL 1 MMOL/ML IV SOLN
10.0000 mL | Freq: Once | INTRAVENOUS | Status: AC | PRN
  Administered 2019-05-21: 12:00:00 10 mL via INTRAVENOUS

## 2019-05-22 LAB — POCT I-STAT CREATININE: Creatinine, Ser: 0.6 mg/dL (ref 0.44–1.00)

## 2019-05-23 LAB — NOVEL CORONAVIRUS, NAA: SARS-CoV-2, NAA: NOT DETECTED

## 2019-07-27 ENCOUNTER — Other Ambulatory Visit: Payer: Self-pay

## 2019-07-27 ENCOUNTER — Encounter: Payer: Self-pay | Admitting: Adult Health

## 2019-07-27 ENCOUNTER — Ambulatory Visit (INDEPENDENT_AMBULATORY_CARE_PROVIDER_SITE_OTHER): Payer: BC Managed Care – PPO | Admitting: Adult Health

## 2019-07-27 ENCOUNTER — Other Ambulatory Visit (HOSPITAL_COMMUNITY)
Admission: RE | Admit: 2019-07-27 | Discharge: 2019-07-27 | Disposition: A | Discharge status: Home / Self Care | Payer: BC Managed Care – PPO | Source: Ambulatory Visit | Attending: Adult Health | Admitting: Adult Health

## 2019-07-27 VITALS — BP 129/85 | HR 106 | Ht 63.0 in | Wt 252.2 lb

## 2019-07-27 DIAGNOSIS — Z1211 Encounter for screening for malignant neoplasm of colon: Secondary | ICD-10-CM | POA: Insufficient documentation

## 2019-07-27 DIAGNOSIS — Z01419 Encounter for gynecological examination (general) (routine) without abnormal findings: Secondary | ICD-10-CM

## 2019-07-27 DIAGNOSIS — Z1212 Encounter for screening for malignant neoplasm of rectum: Secondary | ICD-10-CM

## 2019-07-27 LAB — HEMOCCULT GUIAC POC 1CARD (OFFICE): Fecal Occult Blood, POC: NEGATIVE

## 2019-07-27 NOTE — Progress Notes (Signed)
Patient ID: Allison Goodman, female   DOB: 07-29-1961, 58 y.o.   MRN: [id number] History of Present Illness:  Allison Goodman is a 58 year old white female, divorced PM in for a well woman gyn exam and pap. PCP is Dr Karie Kirks.   Current Medications, Allergies, Past Medical History, Past Surgical History, Family History and Social History were reviewed in [employer] record.     Review of Systems:  Patient denies any headaches, hearing loss, fatigue, blurred vision, shortness of breath, chest pain, abdominal pain, problems with bowel movements, urination, or intercourse. No joint pain or mood swings. Has had pain in left foot, had heel spur, has been wearing a boot and has been out of work Has stress at home and BP and blood sugars have been up, she says  No bleeding on megace, to see Dr Elonda Husky next month   Physical Exam:BP 129/85 (BP Location: Left Arm, Patient Position: Sitting, Cuff Size: Large)   Pulse (!) 106   Ht 5\' 3"  (1.6 m)   Wt 252 lb 3.2 oz (114.4 kg)   BMI 44.68 kg/m  General:  Well developed, well nourished, no acute distress Skin:  Warm and dry Neck:  Midline trachea, normal thyroid, good ROM, no lymphadenopathy Lungs; Clear to auscultation bilaterally Breast:  No dominant palpable mass, retraction, or nipple discharge Cardiovascular: Regular rate and rhythm Abdomen:  Soft, non tender, no hepatosplenomegaly Pelvic:  External genitalia is normal in appearance, no lesions.  The vagina is normal in appearance. Urethra has no lesions or masses. The cervix is bulbous.  Uterus is felt to be normal size, shape, and contour.  No adnexal masses or tenderness noted.Bladder is non tender, no masses felt. Rectal: Good sphincter tone, no polyps, or hemorrhoids felt.  Hemoccult negative. Extremities/musculoskeletal:  No swelling or varicosities noted, no clubbing or cyanosis Psych:  No mood changes, alert and cooperative,seems happy Fall risk is low PHQ 9 score is 6,  denies being suicidal but has stress at home, is on meds.  Examination chaperoned by Rolena Infante LPN   Impression and Plan:   1. Encounter for gynecological examination with Papanicolaou smear of cervix Pap sent Physical in 1 year Pap in 3 if normal Labs with PCP Mammogram yearly To see Dr Elonda Husky next month in follow up on taking megace   2. Encounter for colorectal cancer screening Advised to consider colonoscopy in near future

## 2019-07-30 LAB — CYTOLOGY - PAP
Adequacy: ABSENT
Comment: NEGATIVE
Comment: NEGATIVE
Diagnosis: NEGATIVE
HPV 16: POSITIVE — AB
HPV 18 / 45: NEGATIVE
High risk HPV: POSITIVE — AB

## 2019-08-12 ENCOUNTER — Encounter: Payer: Self-pay | Admitting: Adult Health

## 2019-08-12 ENCOUNTER — Telehealth: Payer: Self-pay | Admitting: Adult Health

## 2019-08-12 DIAGNOSIS — R87810 Cervical high risk human papillomavirus (HPV) DNA test positive: Secondary | ICD-10-CM

## 2019-08-12 HISTORY — DX: Cervical high risk human papillomavirus (HPV) DNA test positive: R87.810

## 2019-08-12 NOTE — Telephone Encounter (Signed)
Pt aware that pap was negative for malignancy but +HPV 16 will get colpo per [facility], has appt with Dr Elonda Husky 1/15

## 2019-08-28 ENCOUNTER — Other Ambulatory Visit: Payer: Self-pay

## 2019-08-28 ENCOUNTER — Encounter: Payer: Self-pay | Admitting: Obstetrics & Gynecology

## 2019-08-28 ENCOUNTER — Ambulatory Visit (INDEPENDENT_AMBULATORY_CARE_PROVIDER_SITE_OTHER): Payer: BC Managed Care – PPO | Admitting: Obstetrics & Gynecology

## 2019-08-28 VITALS — BP 154/90 | HR 97 | Ht 63.0 in | Wt 254.0 lb

## 2019-08-28 DIAGNOSIS — R87810 Cervical high risk human papillomavirus (HPV) DNA test positive: Secondary | ICD-10-CM

## 2019-08-28 MED ORDER — MEGESTROL ACETATE 40 MG PO TABS: ORAL_TABLET | ORAL | 3 refills | Status: DC

## 2019-08-28 NOTE — Progress Notes (Signed)
Colposcopy Procedure Note:  Colposcopy Procedure Note  Indications:  2020     Normal cytology w/+HR HPV w/+16 neg 18 Previous paps are all normal   2019 [facility] recommendation:  Smoker:  No. New sexual partner:  No.  : time frame:    History of abnormal Pap: no  Procedure Details  The risks and benefits of the procedure and Written informed consent obtained.  Speculum placed in vagina and excellent visualization of cervix achieved, cervix swabbed x 3 with acetic acid solution.  Findings: Cervix: no visible lesions, no mosaicism, no punctation and no abnormal vasculature; SCJ visualized 360 degrees without lesions and no biopsies taken. Vaginal inspection: normal without visible lesions. Vulvar colposcopy: vulvar colposcopy not performed.  Specimens: none  Complications: none.  Plan(Based on 2019 [facility] recommendations) Repeat HPV based cytology in 1 year

## 2019-12-18 ENCOUNTER — Telehealth: Payer: Self-pay | Admitting: Obstetrics & Gynecology

## 2019-12-18 NOTE — Telephone Encounter (Signed)
error 

## 2020-02-21 ENCOUNTER — Encounter: Payer: Self-pay | Admitting: Orthopaedic Surgery

## 2020-02-23 ENCOUNTER — Ambulatory Visit (INDEPENDENT_AMBULATORY_CARE_PROVIDER_SITE_OTHER): Payer: BC Managed Care – PPO | Admitting: Orthopaedic Surgery

## 2020-02-23 ENCOUNTER — Other Ambulatory Visit: Payer: Self-pay

## 2020-02-23 ENCOUNTER — Encounter: Payer: Self-pay | Admitting: Orthopaedic Surgery

## 2020-02-23 VITALS — BP 149/89 | HR 107 | Ht 63.0 in | Wt 239.5 lb

## 2020-02-23 DIAGNOSIS — M7711 Lateral epicondylitis, right elbow: Secondary | ICD-10-CM

## 2020-02-23 MED ORDER — MELOXICAM 15 MG PO TABS: 15.0000 mg | ORAL_TABLET | Freq: Every day | ORAL | 5 refills | Status: DC

## 2020-02-23 NOTE — Patient Instructions (Signed)
Out of work note for two weeks. 

## 2020-02-23 NOTE — Progress Notes (Signed)
Subjective:    Patient ID: Allison Goodman, female    DOB: 08/12/1961, 59 y.o.   MRN: [id number]  HPI She slipped and fell about 6 to 7 weeks ago while holding her young grandson.  He was not hurt.  She hurt her right elbow. She has had pain of the elbow since then.  She has seen her primary care and has had prednisone given.  She says it helped a little but she has pain of the right elbow with lifting objects or wringing things.  Her works requires her to use a rotation of the wrist multiple times an hour.  She is taking Mobic, just started.  She has no redness.  She brings x-rays with her.  It shows some slight calcification near the joint on the right elbow but no fracture.  She has no other problem or injury.   Review of Systems  Constitutional: Positive for activity change.  Musculoskeletal: Positive for arthralgias.  Psychiatric/Behavioral: The patient is nervous/anxious.   All other systems reviewed and are negative.  For Review of Systems, all other systems reviewed and are negative.  The following is a summary of the past history medically, past history surgically, known current medicines, social history and family history.  This information is gathered electronically by the computer from prior information and documentation.  I review this each visit and have found including this information at this point in the chart is beneficial and informative.   Past Medical History:  Diagnosis Date  . Anxiety 08/14/2017  . Cellulitis 12/22/2012   Rx doxy and refer to Dr. Hilda Lias may need hard ware removed  . Depression 12/30/2015  . Diabetes (HCC) 01/03/2016  . Elevated blood sugar   . Fatigue 12/28/2014  . Hypertension   . Irregular bleeding 12/30/2015  . Menopause 12/24/2013  . Obesity   . Papanicolaou smear of cervix with positive high risk human papilloma virus (HPV) test 08/12/2019   +HPV 16 on pap to get colpo with Dr Despina Hidden 08/28/19 As per [facility]  . PMB (postmenopausal bleeding)  12/28/2014  . Restless leg   . Seasonal allergies   . Vitamin D deficiency 01/03/2016    Past Surgical History:  Procedure Laterality Date  . ANKLE SURGERY Right    screws placed    Current Outpatient Medications on File Prior to Visit  Medication Sig Dispense Refill  . ALBUTEROL IN Inhale into the lungs as needed.    Marland Kitchen aspirin 81 MG tablet Take 81 mg by mouth daily.    Marland Kitchen atorvastatin (LIPITOR) 20 MG tablet Take 20 mg by mouth daily.    . Cholecalciferol (VITAMIN D3) 5000 units CAPS Take by mouth daily.    . furosemide (LASIX) 20 MG tablet Take 20 mg by mouth daily.    Marland Kitchen HYDROcodone-acetaminophen (NORCO) 10-325 MG tablet Take 1.5 tablets by mouth 4 (four) times daily. PT said she averages about 21 tablets weekly    . lisinopril (PRINIVIL,ZESTRIL) 10 MG tablet TAKE ONE (1) TABLET EACH DAY 90 tablet 4  . loratadine (CLARITIN) 10 MG tablet Take 10 mg by mouth daily as needed for allergies.    Marland Kitchen LORazepam (ATIVAN) 1 MG tablet Take by mouth at bedtime.   4  . megestrol (MEGACE) 40 MG tablet 1 TABLET DAILY 30 tablet 3  . metFORMIN (GLUCOPHAGE) 500 MG tablet TAKE 1 TABLET EVERY MORNING WITH BREAKFAST (Patient taking differently: 500 mg 2 (two) times daily with a meal. ) 90 tablet 4  . potassium  chloride (K-DUR) 10 MEQ tablet     . rOPINIRole (REQUIP) 3 MG tablet Take 3 mg by mouth at bedtime.    Marland Kitchen venlafaxine (EFFEXOR) 75 MG tablet Take 75 mg by mouth 3 (three) times daily with meals. bid  1   No current facility-administered medications on file prior to visit.    Social History   Socioeconomic History  . Marital status: Divorced    Spouse name: Not on file  . Number of children: 2  . Years of education: Not on file  . Highest education level: Not on file  Occupational History  . Not on file  Tobacco Use  . Smoking status: Never Smoker  . Smokeless tobacco: Never Used  Vaping Use  . Vaping Use: Never used  Substance and Sexual Activity  . Alcohol use: No  . Drug use: No  .  Sexual activity: Not Currently    Birth control/protection: None, Post-menopausal  Other Topics Concern  . Not on file  Social History Narrative  . Not on file   Social Determinants of Health   Financial Resource Strain:   . Difficulty of Paying Living Expenses:   Food Insecurity:   . Worried About Programme researcher, broadcasting/film/video in the Last Year:   . Barista in the Last Year:   Transportation Needs:   . Freight forwarder (Medical):   Marland Kitchen Lack of Transportation (Non-Medical):   Physical Activity:   . Days of Exercise per Week:   . Minutes of Exercise per Session:   Stress:   . Feeling of Stress :   Social Connections:   . Frequency of Communication with Friends and Family:   . Frequency of Social Gatherings with Friends and Family:   . Attends Religious Services:   . Active Member of Clubs or Organizations:   . Attends Banker Meetings:   Marland Kitchen Marital Status:   Intimate Partner Violence:   . Fear of Current or Ex-Partner:   . Emotionally Abused:   Marland Kitchen Physically Abused:   . Sexually Abused:     Family History  Problem Relation Age of Onset  . COPD Mother   . Diabetes Mother   . Hyperlipidemia Mother   . Hypertension Mother   . Parkinson's disease Father   . Diabetes Brother   . Hyperlipidemia Brother   . Mitral valve prolapse Sister   . Diabetes Brother   . Hyperlipidemia Brother   . Diabetes Sister   . Hyperlipidemia Sister   . Hypertension Sister   . Heart disease Sister        before age 73    BP (!) 149/89   Pulse (!) 107   Ht 5\' 3"  (1.6 m)   Wt 239 lb 8 oz (108.6 kg)   BMI 42.43 kg/m   Body mass index is 42.43 kg/m.      Objective:   Physical Exam Vitals and nursing note reviewed.  Constitutional:      Appearance: She is well-developed.  HENT:     Head: Normocephalic and atraumatic.  Eyes:     Conjunctiva/sclera: Conjunctivae normal.     Pupils: Pupils are equal, round, and reactive to light.  Cardiovascular:     Rate and  Rhythm: Normal rate and regular rhythm.  Pulmonary:     Effort: Pulmonary effort is normal.  Abdominal:     Palpations: Abdomen is soft.  Musculoskeletal:       Arms:     Cervical back: Normal  range of motion and neck supple.  Skin:    General: Skin is warm and dry.  Neurological:     Mental Status: She is alert and oriented to person, place, and time.     Cranial Nerves: No cranial nerve deficit.     Motor: No abnormal muscle tone.     Coordination: Coordination normal.     Deep Tendon Reflexes: Reflexes are normal and symmetric. Reflexes normal.  Psychiatric:        Behavior: Behavior normal.        Thought Content: Thought content normal.        Judgment: Judgment normal.           Assessment & Plan:   Encounter Diagnosis  Name Primary?  . Lateral epicondylitis, right elbow Yes   I have explained what tennis elbow is.  I have told her it will take time, a longer time than she thinks, for it to get better.  I have told her about ice massage.  Use Aspercreme, Biofreeze or Voltaren Gel.  Continue the Mobic, I will refill.  Return in two weeks.  Stay out of work.  Call if any problem.  Precautions discussed.   Electronically Signed Darreld Mclean, MD 7/13/202112:02 PM

## 2020-03-01 ENCOUNTER — Telehealth: Payer: Self-pay | Admitting: Radiology

## 2020-03-01 NOTE — Telephone Encounter (Signed)
Ruben Reason called from [employer] and asked about the diagnosis for this patient.  We do have a release signed from patient to give info to [employer].  I advised Ruben Reason that the dx is right elbow lateral epicondylitis.

## 2020-03-08 ENCOUNTER — Encounter: Payer: Self-pay | Admitting: Orthopaedic Surgery

## 2020-03-08 ENCOUNTER — Other Ambulatory Visit: Payer: Self-pay

## 2020-03-08 ENCOUNTER — Ambulatory Visit (INDEPENDENT_AMBULATORY_CARE_PROVIDER_SITE_OTHER): Payer: BC Managed Care – PPO | Admitting: Orthopaedic Surgery

## 2020-03-08 ENCOUNTER — Ambulatory Visit: Payer: BC Managed Care – PPO

## 2020-03-08 VITALS — BP 159/103 | HR 117 | Ht 63.0 in | Wt 245.0 lb

## 2020-03-08 DIAGNOSIS — M25562 Pain in left knee: Secondary | ICD-10-CM

## 2020-03-08 DIAGNOSIS — M7711 Lateral epicondylitis, right elbow: Secondary | ICD-10-CM | POA: Diagnosis not present

## 2020-03-08 DIAGNOSIS — Z6841 Body Mass Index (BMI) 40.0 and over, adult: Secondary | ICD-10-CM

## 2020-03-08 NOTE — Progress Notes (Signed)
Patient PJ:ASNKNL Allison Goodman, female DOB:06-06-1961, 59 y.o. ZJQ:[id number]  Chief Complaint  Patient presents with  . Elbow Pain    right   . Knee Pain    left knee pain since fall     HPI  Allison Goodman is a 59 y.o. female who fell and hurt her left knee on the day she last saw me, 02-23-2020.  She has pain medially of the left knee.  It has swelling but no giving way.  She is slightly better but it still hurts.  Her right elbow is better.  Her tennis elbow is less painful.  She is taking the Mobic.   Body mass index is 43.4 kg/m.  The patient meets the AMA guidelines for Morbid (severe) obesity with a BMI > 40.0 and I have recommended weight loss.   ROS  Review of Systems  Constitutional: Positive for activity change.  Musculoskeletal: Positive for arthralgias.  Psychiatric/Behavioral: The patient is nervous/anxious.   All other systems reviewed and are negative.   All other systems reviewed and are negative.  The following is a summary of the past history medically, past history surgically, known current medicines, social history and family history.  This information is gathered electronically by the computer from prior information and documentation.  I review this each visit and have found including this information at this point in the chart is beneficial and informative.    Past Medical History:  Diagnosis Date  . Anxiety 08/14/2017  . Cellulitis 12/22/2012   Rx doxy and refer to Dr. Hilda Lias may need hard ware removed  . Depression 12/30/2015  . Diabetes (HCC) 01/03/2016  . Elevated blood sugar   . Fatigue 12/28/2014  . Hypertension   . Irregular bleeding 12/30/2015  . Menopause 12/24/2013  . Obesity   . Papanicolaou smear of cervix with positive high risk human papilloma virus (HPV) test 08/12/2019   +HPV 16 on pap to get colpo with Dr Despina Hidden 08/28/19 As per [facility]  . PMB (postmenopausal bleeding) 12/28/2014  . Restless leg   . Seasonal allergies   . Vitamin D  deficiency 01/03/2016    Past Surgical History:  Procedure Laterality Date  . ANKLE SURGERY Right    screws placed    Family History  Problem Relation Age of Onset  . COPD Mother   . Diabetes Mother   . Hyperlipidemia Mother   . Hypertension Mother   . Parkinson's disease Father   . Diabetes Brother   . Hyperlipidemia Brother   . Mitral valve prolapse Sister   . Diabetes Brother   . Hyperlipidemia Brother   . Diabetes Sister   . Hyperlipidemia Sister   . Hypertension Sister   . Heart disease Sister        before age 72    Social History Social History   Tobacco Use  . Smoking status: Never Smoker  . Smokeless tobacco: Never Used  Vaping Use  . Vaping Use: Never used  Substance Use Topics  . Alcohol use: No  . Drug use: No    Allergies  Allergen Reactions  . Morphine And Related Nausea And Vomiting    Current Outpatient Medications  Medication Sig Dispense Refill  . ALBUTEROL IN Inhale into the lungs as needed.    Marland Kitchen aspirin 81 MG tablet Take 81 mg by mouth daily.    Marland Kitchen atorvastatin (LIPITOR) 20 MG tablet Take 20 mg by mouth daily.    . Cholecalciferol (VITAMIN D3) 5000 units CAPS Take by  mouth daily.    . furosemide (LASIX) 20 MG tablet Take 20 mg by mouth daily.    . hydrochlorothiazide (MICROZIDE) 12.5 MG capsule     . HYDROcodone-acetaminophen (NORCO) 10-325 MG tablet Take 1.5 tablets by mouth 4 (four) times daily. PT said she averages about 21 tablets weekly// 03/09/20 patient states she takes as written    . lisinopril (PRINIVIL,ZESTRIL) 10 MG tablet TAKE ONE (1) TABLET EACH DAY 90 tablet 4  . loratadine (CLARITIN) 10 MG tablet Take 10 mg by mouth daily as needed for allergies.    Marland Kitchen LORazepam (ATIVAN) 1 MG tablet Take by mouth at bedtime.   4  . megestrol (MEGACE) 40 MG tablet 1 TABLET DAILY 30 tablet 3  . meloxicam (MOBIC) 15 MG tablet Take 1 tablet (15 mg total) by mouth daily. 30 tablet 5  . metFORMIN (GLUCOPHAGE) 500 MG tablet TAKE 1 TABLET EVERY  MORNING WITH BREAKFAST (Patient taking differently: 500 mg 2 (two) times daily with a meal. ) 90 tablet 4  . potassium chloride (K-DUR) 10 MEQ tablet     . rOPINIRole (REQUIP) 3 MG tablet Take 3 mg by mouth at bedtime.    Marland Kitchen venlafaxine (EFFEXOR) 100 MG tablet Take 100 mg by mouth 3 (three) times daily.     No current facility-administered medications for this visit.     Physical Exam  Blood pressure (!) 159/103, pulse (!) 117, height 5\' 3"  (1.6 m), weight (!) 245 lb (111.1 kg).  Constitutional: overall normal hygiene, normal nutrition, well developed, normal grooming, normal body habitus. Assistive device:none  Musculoskeletal: gait and station Limp left, muscle tone and strength are normal, no tremors or atrophy is present.  .  Neurological: coordination overall normal.  Deep tendon reflex/nerve stretch intact.  Sensation normal.  Cranial nerves II-XII intact.   Skin:   Normal overall no scars, lesions, ulcers or rashes. No psoriasis.  Psychiatric: Alert and oriented x 3.  Recent memory intact, remote memory unclear.  Normal mood and affect. Well groomed.  Good eye contact.  Cardiovascular: overall no swelling, no varicosities, no edema bilaterally, normal temperatures of the legs and arms, no clubbing, cyanosis and good capillary refill.  Lymphatic: palpation is normal.  Left knee is tender medially, effusion slight, crepitus, ROM 0 to 110, limp left, stable,  NV intact.  Right elbow with full ROM and slight tenderness lateral epicondyle.  NV intact.  All other systems reviewed and are negative   The patient has been educated about the nature of the problem(s) and counseled on treatment options.  The patient appeared to understand what I have discussed and is in agreement with it.  Encounter Diagnoses  Name Primary?  . Acute pain of left knee Yes  . Lateral epicondylitis, right elbow     PLAN Call if any problems.  Precautions discussed.  Continue current medications.    Return to clinic 2 weeks   I am concerned about medial meniscus tear. She may need MRI.  Electronically Signed , MD 7/27/202110:58 AM

## 2020-03-14 ENCOUNTER — Telehealth: Payer: Self-pay | Admitting: Orthopaedic Surgery

## 2020-03-14 NOTE — Telephone Encounter (Signed)
Called patient regarding additional form received via fax, to relay [employer] protocol, per representative as this form appears to be a new absence management form.  Unable to leave message - voice mail full.

## 2020-03-22 ENCOUNTER — Other Ambulatory Visit: Payer: Self-pay

## 2020-03-22 ENCOUNTER — Encounter: Payer: Self-pay | Admitting: Orthopaedic Surgery

## 2020-03-22 ENCOUNTER — Ambulatory Visit (INDEPENDENT_AMBULATORY_CARE_PROVIDER_SITE_OTHER): Payer: BC Managed Care – PPO | Admitting: Orthopaedic Surgery

## 2020-03-22 VITALS — BP 137/101 | HR 108 | Ht 63.0 in | Wt 244.0 lb

## 2020-03-22 DIAGNOSIS — Z6841 Body Mass Index (BMI) 40.0 and over, adult: Secondary | ICD-10-CM

## 2020-03-22 DIAGNOSIS — M25562 Pain in left knee: Secondary | ICD-10-CM

## 2020-03-22 DIAGNOSIS — M7711 Lateral epicondylitis, right elbow: Secondary | ICD-10-CM | POA: Diagnosis not present

## 2020-03-22 NOTE — Patient Instructions (Signed)
Out of work until September 15.  Then return full.

## 2020-03-22 NOTE — Progress Notes (Signed)
[name], female DOB:August 21, 1960, 59 y.o. [id number]  Chief Complaint  Patient presents with  . Knee Pain  . Elbow Pain    HPI  Allison Goodman is a 59 y.o. female who has right tennis elbow and left knee pain.  The left knee is better.  She has no giving way.  The right elbow is tender but less painful. She has been doing the ice massage.  She has no new trauma.   Body mass index is 43.22 kg/m.  The patient meets the AMA guidelines for Morbid (severe) obesity with a BMI > 40.0 and I have recommended weight loss.   ROS  Review of Systems  Constitutional: Positive for activity change.  Musculoskeletal: Positive for arthralgias.  Psychiatric/Behavioral: The patient is nervous/anxious.   All other systems reviewed and are negative.   All other systems reviewed and are negative.  The following is a summary of the past history medically, past history surgically, known current medicines, social history and family history.  This information is gathered electronically by the computer from prior information and documentation.  I review this each visit and have found including this information at this point in the chart is beneficial and informative.    Past Medical History:  Diagnosis Date  . Anxiety 08/14/2017  . Cellulitis 12/22/2012   Rx doxy and refer to Dr. Hilda Lias may need hard ware removed  . Depression 12/30/2015  . Diabetes (HCC) 01/03/2016  . Elevated blood sugar   . Fatigue 12/28/2014  . Hypertension   . Irregular bleeding 12/30/2015  . Menopause 12/24/2013  . Obesity   . Papanicolaou smear of cervix with positive high risk human papilloma virus (HPV) test 08/12/2019   +HPV 16 on pap to get colpo with Dr Despina Hidden 08/28/19 As per [facility]  . PMB (postmenopausal bleeding) 12/28/2014  . Restless leg   . Seasonal allergies   . Vitamin D deficiency 01/03/2016    Past Surgical History:  Procedure Laterality Date  . ANKLE SURGERY Right    screws placed     Family History  Problem Relation Age of Onset  . COPD Mother   . Diabetes Mother   . Hyperlipidemia Mother   . Hypertension Mother   . Parkinson's disease Father   . Diabetes Brother   . Hyperlipidemia Brother   . Mitral valve prolapse Sister   . Diabetes Brother   . Hyperlipidemia Brother   . Diabetes Sister   . Hyperlipidemia Sister   . Hypertension Sister   . Heart disease Sister        before age 15    Social History Social History   Tobacco Use  . Smoking status: Never Smoker  . Smokeless tobacco: Never Used  Vaping Use  . Vaping Use: Never used  Substance Use Topics  . Alcohol use: No  . Drug use: No    Allergies  Allergen Reactions  . Morphine And Related Nausea And Vomiting    Current Outpatient Medications  Medication Sig Dispense Refill  . ALBUTEROL IN Inhale into the lungs as needed.    Marland Kitchen aspirin 81 MG tablet Take 81 mg by mouth daily.    Marland Kitchen atorvastatin (LIPITOR) 20 MG tablet Take 20 mg by mouth daily.    . Cholecalciferol (VITAMIN D3) 5000 units CAPS Take by mouth daily.    . furosemide (LASIX) 20 MG tablet Take 20 mg by mouth daily.    . hydrochlorothiazide (MICROZIDE) 12.5 MG capsule     .  HYDROcodone-acetaminophen (NORCO) 10-325 MG tablet Take 1.5 tablets by mouth 4 (four) times daily. PT said she averages about 21 tablets weekly// 03/09/20 patient states she takes as written    . lisinopril (PRINIVIL,ZESTRIL) 10 MG tablet TAKE ONE (1) TABLET EACH DAY 90 tablet 4  . loratadine (CLARITIN) 10 MG tablet Take 10 mg by mouth daily as needed for allergies.    Marland Kitchen LORazepam (ATIVAN) 1 MG tablet Take by mouth at bedtime.   4  . megestrol (MEGACE) 40 MG tablet 1 TABLET DAILY 30 tablet 3  . meloxicam (MOBIC) 15 MG tablet Take 1 tablet (15 mg total) by mouth daily. 30 tablet 5  . metFORMIN (GLUCOPHAGE) 500 MG tablet TAKE 1 TABLET EVERY MORNING WITH BREAKFAST (Patient taking differently: 500 mg 2 (two) times daily with a meal. ) 90 tablet 4  . potassium  chloride (K-DUR) 10 MEQ tablet     . rOPINIRole (REQUIP) 3 MG tablet Take 3 mg by mouth at bedtime.    Marland Kitchen venlafaxine (EFFEXOR) 100 MG tablet Take 100 mg by mouth 3 (three) times daily.     No current facility-administered medications for this visit.     Physical Exam  Blood pressure (!) 137/101, pulse (!) 108, height 5\' 3"  (1.6 m), weight 244 lb (110.7 kg).  Constitutional: overall normal hygiene, normal nutrition, well developed, normal grooming, normal body habitus. Assistive device:none  Musculoskeletal: gait and station Limp none, muscle tone and strength are normal, no tremors or atrophy is present.  .  Neurological: coordination overall normal.  Deep tendon reflex/nerve stretch intact.  Sensation normal.  Cranial nerves II-XII intact.   Skin:   Normal overall no scars, lesions, ulcers or rashes. No psoriasis.  Psychiatric: Alert and oriented x 3.  Recent memory intact, remote memory unclear.  Normal mood and affect. Well groomed.  Good eye contact.  Cardiovascular: overall no swelling, no varicosities, no edema bilaterally, normal temperatures of the legs and arms, no clubbing, cyanosis and good capillary refill.  Lymphatic: palpation is normal.  Right elbow has tenderness over the lateral epicondyle.  There is no redness.  ROM is full.  Pain to resisted extension of the wrist.  Left knee has sight effusion, crepitus, ROM is nearly full.  Gait is good.  All other systems reviewed and are negative   The patient has been educated about the nature of the problem(s) and counseled on treatment options.  The patient appeared to understand what I have discussed and is in agreement with it.  Encounter Diagnoses  Name Primary?  . Lateral epicondylitis, right elbow Yes  . Acute pain of left knee   . Body mass index 40.0-44.9, adult (HCC)   . Morbid obesity (HCC)     PLAN Call if any problems.  Precautions discussed.  Continue current medications.   Return to clinic 2  months   Out of work until September 15.  Electronically Signed September 17, MD 8/10/20219:16 AM

## 2020-04-26 ENCOUNTER — Ambulatory Visit (INDEPENDENT_AMBULATORY_CARE_PROVIDER_SITE_OTHER): Payer: BC Managed Care – PPO | Admitting: Orthopaedic Surgery

## 2020-04-26 ENCOUNTER — Other Ambulatory Visit: Payer: Self-pay

## 2020-04-26 ENCOUNTER — Encounter: Payer: Self-pay | Admitting: Orthopaedic Surgery

## 2020-04-26 VITALS — BP 140/85 | HR 101 | Ht 63.0 in | Wt 243.0 lb

## 2020-04-26 DIAGNOSIS — M7711 Lateral epicondylitis, right elbow: Secondary | ICD-10-CM | POA: Diagnosis not present

## 2020-04-26 DIAGNOSIS — Z6841 Body Mass Index (BMI) 40.0 and over, adult: Secondary | ICD-10-CM

## 2020-04-26 NOTE — Patient Instructions (Signed)
OUT OF WORK ?

## 2020-04-26 NOTE — Progress Notes (Signed)
Patient Allison Goodman, female DOB:06/06/1961, 59 y.o. HRC:[id number]  Chief Complaint  Patient presents with  . Elbow Pain    right     HPI  Allison Goodman is a 59 y.o. female who has worsening of the right tennis elbow pain.  She has no redness or new trauma but it hurts more and more.  I will keep her out of work, begin PT.  Continue the ice massage.   Body mass index is 43.05 kg/m.  The patient meets the AMA guidelines for Morbid (severe) obesity with a BMI > 40.0 and I have recommended weight loss.   ROS  Review of Systems  Constitutional: Positive for activity change.  Musculoskeletal: Positive for arthralgias.  Psychiatric/Behavioral: The patient is nervous/anxious.   All other systems reviewed and are negative.   All other systems reviewed and are negative.  The following is a summary of the past history medically, past history surgically, known current medicines, social history and family history.  This information is gathered electronically by the computer from prior information and documentation.  I review this each visit and have found including this information at this point in the chart is beneficial and informative.    Past Medical History:  Diagnosis Date  . Anxiety 08/14/2017  . Cellulitis 12/22/2012   Rx doxy and refer to Dr. Hilda Lias may need hard ware removed  . Depression 12/30/2015  . Diabetes (HCC) 01/03/2016  . Elevated blood sugar   . Fatigue 12/28/2014  . Hypertension   . Irregular bleeding 12/30/2015  . Menopause 12/24/2013  . Obesity   . Papanicolaou smear of cervix with positive high risk human papilloma virus (HPV) test 08/12/2019   +HPV 16 on pap to get colpo with Dr Despina Hidden 08/28/19 As per [facility]  . PMB (postmenopausal bleeding) 12/28/2014  . Restless leg   . Seasonal allergies   . Vitamin D deficiency 01/03/2016    Past Surgical History:  Procedure Laterality Date  . ANKLE SURGERY Right    screws placed    Family History  Problem  Relation Age of Onset  . COPD Mother   . Diabetes Mother   . Hyperlipidemia Mother   . Hypertension Mother   . Parkinson's disease Father   . Diabetes Brother   . Hyperlipidemia Brother   . Mitral valve prolapse Sister   . Diabetes Brother   . Hyperlipidemia Brother   . Diabetes Sister   . Hyperlipidemia Sister   . Hypertension Sister   . Heart disease Sister        before age 27    Social History Social History   Tobacco Use  . Smoking status: Never Smoker  . Smokeless tobacco: Never Used  Vaping Use  . Vaping Use: Never used  Substance Use Topics  . Alcohol use: No  . Drug use: No    Allergies  Allergen Reactions  . Morphine And Related Nausea And Vomiting    Current Outpatient Medications  Medication Sig Dispense Refill  . ALBUTEROL IN Inhale into the lungs as needed.    Marland Kitchen aspirin 81 MG tablet Take 81 mg by mouth daily.    Marland Kitchen atorvastatin (LIPITOR) 20 MG tablet Take 20 mg by mouth daily.    . Cholecalciferol (VITAMIN D3) 5000 units CAPS Take by mouth daily.    . furosemide (LASIX) 20 MG tablet Take 20 mg by mouth daily.    . hydrochlorothiazide (MICROZIDE) 12.5 MG capsule     . HYDROcodone-acetaminophen (NORCO) 10-325 MG  tablet Take 1.5 tablets by mouth 4 (four) times daily. PT said she averages about 21 tablets weekly// 03/09/20 patient states she takes as written    . lisinopril (PRINIVIL,ZESTRIL) 10 MG tablet TAKE ONE (1) TABLET EACH DAY 90 tablet 4  . loratadine (CLARITIN) 10 MG tablet Take 10 mg by mouth daily as needed for allergies.    Marland Kitchen LORazepam (ATIVAN) 1 MG tablet Take by mouth at bedtime.   4  . megestrol (MEGACE) 40 MG tablet 1 TABLET DAILY 30 tablet 3  . meloxicam (MOBIC) 15 MG tablet Take 1 tablet (15 mg total) by mouth daily. 30 tablet 5  . metFORMIN (GLUCOPHAGE) 500 MG tablet TAKE 1 TABLET EVERY MORNING WITH BREAKFAST (Patient taking differently: 500 mg 2 (two) times daily with a meal. ) 90 tablet 4  . potassium chloride (K-DUR) 10 MEQ tablet      . rOPINIRole (REQUIP) 3 MG tablet Take 3 mg by mouth at bedtime.    Marland Kitchen venlafaxine (EFFEXOR) 100 MG tablet Take 100 mg by mouth 3 (three) times daily.     No current facility-administered medications for this visit.     Physical Exam  Blood pressure 140/85, pulse (!) 101, height 5\' 3"  (1.6 m), weight 243 lb (110.2 kg).  Constitutional: overall normal hygiene, normal nutrition, well developed, normal grooming, normal body habitus. Assistive device:none  Musculoskeletal: gait and station Limp none, muscle tone and strength are normal, no tremors or atrophy is present.  .  Neurological: coordination overall normal.  Deep tendon reflex/nerve stretch intact.  Sensation normal.  Cranial nerves II-XII intact.   Skin:   Normal overall no scars, lesions, ulcers or rashes. No psoriasis.  Psychiatric: Alert and oriented x 3.  Recent memory intact, remote memory unclear.  Normal mood and affect. Well groomed.  Good eye contact.  Cardiovascular: overall no swelling, no varicosities, no edema bilaterally, normal temperatures of the legs and arms, no clubbing, cyanosis and good capillary refill.  Lymphatic: palpation is normal.  Right lateral epicondyle is very tender, ROM is full.  NV intact.  All other systems reviewed and are negative   The patient has been educated about the nature of the problem(s) and counseled on treatment options.  The patient appeared to understand what I have discussed and is in agreement with it.  Encounter Diagnoses  Name Primary?  . Lateral epicondylitis, right elbow Yes  . Body mass index 40.0-44.9, adult (HCC)   . Morbid obesity (HCC)     PLAN Call if any problems.  Precautions discussed.  Continue current medications.   Return to clinic 1 month   Stay out of work  Begin PT in [place].  Electronically Signed [place], MD 9/14/202110:13 AM

## 2020-04-27 ENCOUNTER — Ambulatory Visit: Payer: BC Managed Care – PPO | Admitting: Physical Therapy

## 2020-04-28 ENCOUNTER — Encounter: Payer: Self-pay | Admitting: Physical Therapy

## 2020-04-28 ENCOUNTER — Other Ambulatory Visit: Payer: Self-pay

## 2020-04-28 ENCOUNTER — Ambulatory Visit: Payer: BC Managed Care – PPO | Attending: Orthopaedic Surgery | Admitting: Physical Therapy

## 2020-04-28 DIAGNOSIS — M6281 Muscle weakness (generalized): Secondary | ICD-10-CM | POA: Insufficient documentation

## 2020-04-28 DIAGNOSIS — M25521 Pain in right elbow: Secondary | ICD-10-CM | POA: Insufficient documentation

## 2020-04-28 NOTE — Therapy (Signed)
[facility] [address] [place], [place], [address] Phone: [phone]   Fax:  [phone]  Physical Therapy Evaluation  Patient Details  Name: Allison Goodman MRN: [id number] Date of Birth: 09-25-1960 Referring Provider (PT): Darreld Mclean, MD   Encounter Date: 04/28/2020   PT End of Session - 04/28/20 1255    Visit Number 1    Number of Visits 4    Date for PT Re-Evaluation 06/02/20    PT Start Time 1115    PT Stop Time 1200    PT Time Calculation (min) 45 min    Activity Tolerance Patient limited by pain    Behavior During Therapy [facility] for tasks assessed/performed           Past Medical History:  Diagnosis Date  . Anxiety 08/14/2017  . Cellulitis 12/22/2012   Rx doxy and refer to Dr. Hilda Lias may need hard ware removed  . Depression 12/30/2015  . Diabetes (HCC) 01/03/2016  . Elevated blood sugar   . Fatigue 12/28/2014  . Hypertension   . Irregular bleeding 12/30/2015  . Menopause 12/24/2013  . Obesity   . Papanicolaou smear of cervix with positive high risk human papilloma virus (HPV) test 08/12/2019   +HPV 16 on pap to get colpo with Dr Despina Hidden 08/28/19 As per [facility]  . PMB (postmenopausal bleeding) 12/28/2014  . Restless leg   . Seasonal allergies   . Vitamin D deficiency 01/03/2016    Past Surgical History:  Procedure Laterality Date  . ANKLE SURGERY Right    screws placed    There were no vitals filed for this visit.    Subjective Assessment - 04/28/20 1230    Subjective COVID-19 screening performed upon arrival. Patient arrives with right elbow pain that began after a fall while holding her grandson on 01/24/2020. Patient reports pain with household activities such as sweeping and mopping and work activities. Patient reports pain at worst as 8/10 and pain at best as 2-3/10. Patient is currently out of work and will be reassessed for return to work next MD visit. Patient's goals are to decrease pain, improve movement,  improve strength and improve ability to perform home and work activities.    Pertinent History HTN, DM, history of ankle surgery, depression    Limitations House hold activities;Lifting    Diagnostic tests x-ray: (-) fracture    Patient Stated Goals return to PLOF; improve ability to perform job activities    Currently in Pain? Yes    Pain Score 6     Pain Location Elbow    Pain Orientation Right    Pain Descriptors / Indicators Shooting;Numbness    Pain Type Acute pain    Pain Radiating Towards R hand    Pain Onset More than a month ago    Pain Frequency Constant    Aggravating Factors  excessive use    Pain Relieving Factors resting, ice, rub with voltaren    Effect of Pain on Daily Activities "have to rest it when doing things like housework, hurts when I pick up heavy objects"              [facility] PT Assessment - 04/28/20 0001      Assessment   Medical Diagnosis Lateral epicondylitis, right elbow    Referring Provider (PT) Darreld Mclean, MD    Onset Date/Surgical Date 04/25/20    Hand Dominance Right    Next MD Visit 05/24/2020    Prior Therapy no  Precautions   Precautions None      Restrictions   Weight Bearing Restrictions No      Balance Screen   Has the patient fallen in the past 6 months Yes    How many times? 1    Has the patient had a decrease in activity level because of a fear of falling?  No    Is the patient reluctant to leave their home because of a fear of falling?  No      Home Environment   Living Environment Private residence      Prior Function   Level of Independence Independent with basic ADLs      Posture/Postural Control   Posture/Postural Control Postural limitations    Postural Limitations Rounded Shoulders;Forward head      ROM / Strength   AROM / PROM / Strength Strength;AROM;PROM      AROM   AROM Assessment Site Elbow;Wrist    Right/Left Elbow Right    Right Elbow Flexion 142    Right Elbow Extension 8    Right/Left Wrist  Right    Right Wrist Extension 40 Degrees    Right Wrist Flexion 44 Degrees    Right Wrist Radial Deviation 18 Degrees    Right Wrist Ulnar Deviation 20 Degrees      PROM   PROM Assessment Site Elbow    Right/Left Elbow Right      Strength   Strength Assessment Site Hand;Elbow;Wrist    Right/Left Elbow Right    Right Elbow Flexion 4-/5    Right Elbow Extension 4-/5    Right/Left Wrist Right    Right Wrist Flexion 4/5    Right Wrist Extension 4/5    Right Wrist Radial Deviation 4/5    Right Wrist Ulnar Deviation 4/5    Right/Left hand Right;Left    Right Hand Grip (lbs) 22    Left Hand Grip (lbs) 55      Palpation   Palpation comment Patient very tender to palpation to lateral elbow and triceps region. Patient with increased adhesions in right wrist extensors      Special Tests   Other special tests (+) R Mill's and Cozen's Test                      Objective measurements completed on examination: See above findings.               PT Education - 04/28/20 1254    Education Details putty; wrist flexion, wrist extension, scapular retraction, ice massage    Person(s) Educated Patient    Methods Explanation;Demonstration;Handout    Comprehension Verbalized understanding;Returned demonstration               PT Long Term Goals - 04/28/20 1303      PT LONG TERM GOAL #1   Title Patient will be independent with HEP    Time 4    Period Weeks    Status New      PT LONG TERM GOAL #2   Title Patient will report ability to perform ADLs and home activities with right elbow pain less than or equal to 3/10    Time 4    Period Weeks    Status New      PT LONG TERM GOAL #3   Title Patient will demonstrate 35+ lbs of right grip strength to improve ability to perform work activities.    Time 4    Period Weeks  Status New                  Plan - 04/28/20 1457    Clinical Impression Statement Patient is a 59 year old right handed female  who presents to physical therapy with right elbow pain, decreased right wrist and elbow ROM, decreased right MMT and grip strength that began after a fall on 01/24/2020. Patient with notable edema in lateral elbow. Patient very tender to palpation to right lateral elbow, triceps region, and wrist extensors. Patient and PT discussed plan of care and HEP to which patient reported understanding. Patient would benefit from skilled physical therapy to address deficits and goals.    Personal Factors and Comorbidities Time since onset of injury/illness/exacerbation;Age;Comorbidity 3+    Comorbidities HTN, DM, history of ankle surgery, depression    Examination-Activity Limitations Carry;Lift;Caring for Others    Examination-Participation Restrictions Occupation    Stability/Clinical Decision Making Stable/Uncomplicated    Clinical Decision Making Low    Rehab Potential Fair    PT Frequency 1x / week    PT Duration 4 weeks    PT Treatment/Interventions Electrical Stimulation;Cryotherapy;Ultrasound;Moist Heat;ADLs/Self Care Home Management;Iontophoresis 4mg /ml Dexamethasone;Manual techniques;Passive range of motion;Vasopneumatic Device;Patient/family education;Therapeutic exercise;Neuromuscular re-education;Therapeutic activities    PT Next Visit Plan R elbow eccentric exercises, grip strengthening, STW/M, combo to R elbow, modalities PRN for pain relief    PT Home Exercise Plan see patient education section    Consulted and Agree with Plan of Care Patient           Patient will benefit from skilled therapeutic intervention in order to improve the following deficits and impairments:  Decreased strength, Decreased activity tolerance, Decreased range of motion, Pain, Impaired UE functional use, Increased edema  Visit Diagnosis: Pain in right elbow - Plan: PT plan of care cert/re-cert  Muscle weakness (generalized) - Plan: PT plan of care cert/re-cert     Problem List Patient Active Problem List    Diagnosis Date Noted  . Papanicolaou smear of cervix with positive high risk human papilloma virus (HPV) test 08/12/2019  . Encounter for gynecological examination with Papanicolaou smear of cervix 07/27/2019  . Encounter for colorectal cancer screening 07/27/2019  . Vitamin D deficiency 01/03/2016  . Diabetes (HCC) 01/03/2016  . Depression 12/30/2015  . Irregular bleeding 12/30/2015  . PMB (postmenopausal bleeding) 12/28/2014  . Fatigue 12/28/2014  . Menopause 12/24/2013  . Swelling of limb 05/28/2013  . Leukocytosis, unspecified 03/20/2013  . Hypokalemia 03/20/2013  . Essential hypertension   . Cellulitis of right leg 03/19/2013  . Obesity 11/19/2012    01/19/2013, PT, DPT 04/28/2020, 3:16 PM  [facility] [address] [place], [place], [place] Phone: [phone]   Fax:  [phone]  Name: Allison Goodman MRN: Harriet Butte Date of Birth: 02-10-1961

## 2020-05-04 ENCOUNTER — Ambulatory Visit: Payer: BC Managed Care – PPO | Admitting: Physical Therapy

## 2020-05-09 ENCOUNTER — Ambulatory Visit: Payer: BC Managed Care – PPO | Admitting: Physical Therapy

## 2020-05-10 ENCOUNTER — Other Ambulatory Visit: Payer: Self-pay

## 2020-05-10 ENCOUNTER — Encounter: Payer: Self-pay | Admitting: *Deleted

## 2020-05-10 ENCOUNTER — Ambulatory Visit: Payer: BC Managed Care – PPO | Admitting: *Deleted

## 2020-05-10 DIAGNOSIS — M6281 Muscle weakness (generalized): Secondary | ICD-10-CM

## 2020-05-10 DIAGNOSIS — M25521 Pain in right elbow: Secondary | ICD-10-CM

## 2020-05-10 NOTE — Therapy (Signed)
[facility] [address] [place], [place], [address] Phone: [phone]   Fax:  [phone]  Physical Therapy Treatment  Patient Details  Name: Allison Goodman MRN: [id number] Date of Birth: 1960-12-23 Referring Provider (PT): Darreld Mclean, MD   Encounter Date: 05/10/2020   PT End of Session - 05/10/20 1453    Visit Number 2    Number of Visits 4    Date for PT Re-Evaluation 06/02/20    PT Start Time 1345    PT Stop Time 1433    PT Time Calculation (min) 48 min           Past Medical History:  Diagnosis Date  . Anxiety 08/14/2017  . Cellulitis 12/22/2012   Rx doxy and refer to Dr. Hilda Lias may need hard ware removed  . Depression 12/30/2015  . Diabetes (HCC) 01/03/2016  . Elevated blood sugar   . Fatigue 12/28/2014  . Hypertension   . Irregular bleeding 12/30/2015  . Menopause 12/24/2013  . Obesity   . Papanicolaou smear of cervix with positive high risk human papilloma virus (HPV) test 08/12/2019   +HPV 16 on pap to get colpo with Dr Despina Hidden 08/28/19 As per [facility]  . PMB (postmenopausal bleeding) 12/28/2014  . Restless leg   . Seasonal allergies   . Vitamin D deficiency 01/03/2016    Past Surgical History:  Procedure Laterality Date  . ANKLE SURGERY Right    screws placed    There were no vitals filed for this visit.   Subjective Assessment - 05/10/20 1347    Subjective COVID-19 screening performed upon arrival. Patient arrives with right elbow pain that began after a fall while holding her grandson on 01/24/2020. Doing Exs at home    Pertinent History HTN, DM, history of ankle surgery, depression    Limitations House hold activities;Lifting    Diagnostic tests x-ray: (-) fracture    Patient Stated Goals return to PLOF; improve ability to perform job activities    Currently in Pain? Yes    Pain Score 6     Pain Location Elbow    Pain Orientation Right    Pain Descriptors / Indicators Shooting;Numbness    Pain Type  Acute pain    Pain Onset More than a month ago                             [facility] Adult PT Treatment/Exercise - 05/10/20 0001      Modalities   Modalities Electrical Stimulation;Moist Heat;Ultrasound      Moist Heat Therapy   Number Minutes Moist Heat 15 Minutes    Moist Heat Location Elbow      Electrical Stimulation   Electrical Stimulation Location RT elbow lateral aspect    Electrical Stimulation Action Premod    Electrical Stimulation Parameters 80-150hz  x 15 mins    Electrical Stimulation Goals Pain      Ultrasound   Ultrasound Location RT elbow wrist extensors    Ultrasound Parameters 3.3 mhz, 1.5 w/cm2 x 10 mins    Ultrasound Goals Pain      Manual Therapy   Manual Therapy Soft tissue mobilization    Soft tissue mobilization STW / IASTM to RT elbow wrist extensors                       PT Long Term Goals - 04/28/20 1303      PT LONG TERM GOAL #1  Title Patient will be independent with HEP    Time 4    Period Weeks    Status New      PT LONG TERM GOAL #2   Title Patient will report ability to perform ADLs and home activities with right elbow pain less than or equal to 3/10    Time 4    Period Weeks    Status New      PT LONG TERM GOAL #3   Title Patient will demonstrate 35+ lbs of right grip strength to improve ability to perform work activities.    Time 4    Period Weeks    Status New                 Plan - 05/10/20 1453    Clinical Impression Statement Pt arrived today doing okay, but c/o pain in RT elbow with ADL's. Rx consisted of [place] combo f/b STW/ IASTM to RT elbow area wrist extensors. Notable soreness during STW. Normal modality response today.    Personal Factors and Comorbidities Time since onset of injury/illness/exacerbation;Age;Comorbidity 3+    Comorbidities HTN, DM, history of ankle surgery, depression    Examination-Activity Limitations Carry;Lift;Caring for Others    Examination-Participation  Restrictions Occupation    Stability/Clinical Decision Making Stable/Uncomplicated    Rehab Potential Fair    PT Frequency 1x / week    PT Duration 4 weeks    PT Treatment/Interventions Electrical Stimulation;Cryotherapy;Ultrasound;Moist Heat;ADLs/Self Care Home Management;Iontophoresis 4mg /ml Dexamethasone;Manual techniques;Passive range of motion;Vasopneumatic Device;Patient/family education;Therapeutic exercise;Neuromuscular re-education;Therapeutic activities    PT Next Visit Plan R elbow eccentric exercises, grip strengthening, STW/M, combo to R elbow, modalities PRN for pain relief    PT Home Exercise Plan see patient education section    Consulted and Agree with Plan of Care Patient           Patient will benefit from skilled therapeutic intervention in order to improve the following deficits and impairments:  Decreased strength, Decreased activity tolerance, Decreased range of motion, Pain, Impaired UE functional use, Increased edema  Visit Diagnosis: Pain in right elbow  Muscle weakness (generalized)     Problem List Patient Active Problem List   Diagnosis Date Noted  . Papanicolaou smear of cervix with positive high risk human papilloma virus (HPV) test 08/12/2019  . Encounter for gynecological examination with Papanicolaou smear of cervix 07/27/2019  . Encounter for colorectal cancer screening 07/27/2019  . Vitamin D deficiency 01/03/2016  . Diabetes (HCC) 01/03/2016  . Depression 12/30/2015  . Irregular bleeding 12/30/2015  . PMB (postmenopausal bleeding) 12/28/2014  . Fatigue 12/28/2014  . Menopause 12/24/2013  . Swelling of limb 05/28/2013  . Leukocytosis, unspecified 03/20/2013  . Hypokalemia 03/20/2013  . Essential hypertension   . Cellulitis of right leg 03/19/2013  . Obesity 11/19/2012    Sotiria Keast,CHRIS, PTA 05/10/2020, 3:07 PM  [facility] [address] [place], [place], [place] Phone: [phone]   Fax:   [phone]  Name: Allison Goodman MRN: Harriet Butte Date of Birth: February 09, 1961

## 2020-05-12 ENCOUNTER — Telehealth: Payer: Self-pay | Admitting: Physical Therapy

## 2020-05-12 ENCOUNTER — Ambulatory Visit: Payer: BC Managed Care – PPO | Admitting: Physical Therapy

## 2020-05-12 NOTE — Telephone Encounter (Signed)
S/w pt she is not feeling well and rs for Thrusday 10/7

## 2020-05-16 ENCOUNTER — Ambulatory Visit: Payer: BC Managed Care – PPO | Attending: Orthopaedic Surgery | Admitting: Physical Therapy

## 2020-05-16 ENCOUNTER — Other Ambulatory Visit: Payer: Self-pay

## 2020-05-16 DIAGNOSIS — M25521 Pain in right elbow: Secondary | ICD-10-CM | POA: Diagnosis not present

## 2020-05-16 DIAGNOSIS — M6281 Muscle weakness (generalized): Secondary | ICD-10-CM | POA: Diagnosis present

## 2020-05-16 NOTE — Therapy (Signed)
[facility] [place], [place], [address] Phone: [phone]   Fax:  [phone]  Physical Therapy Treatment  Patient Details  Name: Allison Goodman MRN: [id number] Date of Birth: 02-Apr-1961 Referring Provider (PT): Sanjuana Kava, MD   Encounter Date: 05/16/2020   PT End of Session - 05/16/20 6979    Visit Number 3    Number of Visits 4    Date for PT Re-Evaluation 06/02/20    PT Start Time 0904    PT Stop Time 0945    PT Time Calculation (min) 41 min    Activity Tolerance Patient tolerated treatment well;Patient limited by pain    Behavior During Therapy [facility] for tasks assessed/performed           Past Medical History:  Diagnosis Date  . Anxiety 08/14/2017  . Cellulitis 12/22/2012   Rx doxy and refer to Dr. Luna Glasgow may need hard ware removed  . Depression 12/30/2015  . Diabetes ([place]) 01/03/2016  . Elevated blood sugar   . Fatigue 12/28/2014  . Hypertension   . Irregular bleeding 12/30/2015  . Menopause 12/24/2013  . Obesity   . Papanicolaou smear of cervix with positive high risk human papilloma virus (HPV) test 08/12/2019   +HPV 16 on pap to get colpo with Dr Elonda Husky 08/28/19 As per [facility]  . PMB (postmenopausal bleeding) 12/28/2014  . Restless leg   . Seasonal allergies   . Vitamin D deficiency 01/03/2016    Past Surgical History:  Procedure Laterality Date  . ANKLE SURGERY Right    screws placed    There were no vitals filed for this visit.   Subjective Assessment - 05/16/20 0918    Subjective COVID-19 screening performed upon arrival. Patient reported improvement thus far yet 3/10 pain ongoing today.    Pertinent History HTN, DM, history of ankle surgery, depression    Limitations House hold activities;Lifting    Diagnostic tests x-ray: (-) fracture    Patient Stated Goals return to PLOF; improve ability to perform job activities    Currently in Pain? Yes    Pain Score 3     Pain Location Elbow    Pain  Orientation Right    Pain Descriptors / Indicators Discomfort;Sore    Pain Type Acute pain    Pain Onset More than a month ago    Pain Frequency Intermittent    Aggravating Factors  use of arm and excessive movement    Pain Relieving Factors rest and ice              [facility] PT Assessment - 05/16/20 0001      Strength   Right Hand Grip (lbs) 38                         [facility] Adult PT Treatment/Exercise - 05/16/20 0001      Modalities   Modalities Iontophoresis      Moist Heat Therapy   Number Minutes Moist Heat 15 Minutes    Moist Heat Location Elbow      Electrical Stimulation   Electrical Stimulation Location RT elbow lateral aspect    Electrical Stimulation Action premod    Electrical Stimulation Parameters 80-150hz  x85mn    Electrical Stimulation Goals Pain      Ultrasound   Ultrasound Location Right elbow    Ultrasound Parameters [place]@ 3.373m/50%/1.5w/cm2 x1041m   Ultrasound Goals Pain      Iontophoresis   Type of  Iontophoresis Dexamethasone    Location Right elbow    Dose 28m 1 of 6    Time 8      Manual Therapy   Manual Therapy Soft tissue mobilization    Soft tissue mobilization STW  to RT elbow wrist extensors                       PT Long Term Goals - 05/16/20 0936      PT LONG TERM GOAL #1   Title Patient will be independent with HEP    Time 4    Period Weeks    Status On-going      PT LONG TERM GOAL #2   Title Patient will report ability to perform ADLs and home activities with right elbow pain less than or equal to 3/10    Time 4    Period Weeks    Status On-going      PT LONG TERM GOAL #3   Title Patient will demonstrate 35+ lbs of right grip strength to improve ability to perform work activities.    Baseline 38# Met 05/16/20    Time 4    Period Weeks    Status Achieved                 Plan - 05/16/20 0956    Clinical Impression Statement Patient tolerated treatment well today. Patient has  reported improvement with less pain yet at times pain increases with use of elbow. Patient has improved grip strength today to 38#. Today started ionto patch per POC. Patient has 1 visit left then to MD for F/U appt and will decide to cont with therapy or DC. Patient has not returned to work at this time. Today met grip strength goal with others ongoing.    Personal Factors and Comorbidities Time since onset of injury/illness/exacerbation;Age;Comorbidity 3+    Comorbidities HTN, DM, history of ankle surgery, depression    Examination-Activity Limitations Carry;Lift;Caring for Others    Examination-Participation Restrictions Occupation    Stability/Clinical Decision Making Stable/Uncomplicated    Rehab Potential Fair    PT Frequency 1x / week    PT Duration 4 weeks    PT Treatment/Interventions Electrical Stimulation;Cryotherapy;Ultrasound;Moist Heat;ADLs/Self Care Home Management;Iontophoresis 464mml Dexamethasone;Manual techniques;Passive range of motion;Vasopneumatic Device;Patient/family education;Therapeutic exercise;Neuromuscular re-education;Therapeutic activities    PT Next Visit Plan assess ionto and cont with POC and consider R elbow eccentric exercises with HEP. STW/M, combo to R elbow, modalities PRN for pain relief    Consulted and Agree with Plan of Care Patient           Patient will benefit from skilled therapeutic intervention in order to improve the following deficits and impairments:  Decreased strength, Decreased activity tolerance, Decreased range of motion, Pain, Impaired UE functional use, Increased edema  Visit Diagnosis: Pain in right elbow  Muscle weakness (generalized)     Problem List Patient Active Problem List   Diagnosis Date Noted  . Papanicolaou smear of cervix with positive high risk human papilloma virus (HPV) test 08/12/2019  . Encounter for gynecological examination with Papanicolaou smear of cervix 07/27/2019  . Encounter for colorectal cancer  screening 07/27/2019  . Vitamin D deficiency 01/03/2016  . Diabetes ([place]/2017  . Depression 12/30/2015  . Irregular bleeding 12/30/2015  . PMB (postmenopausal bleeding) 12/28/2014  . Fatigue 12/28/2014  . Menopause 12/24/2013  . Swelling of limb 05/28/2013  . Leukocytosis, unspecified 03/20/2013  . Hypokalemia 03/20/2013  . Essential hypertension   .  Cellulitis of right leg 03/19/2013  . Obesity 11/19/2012    Phillips Climes, PTA 05/16/2020, 10:34 AM  [facility] [place], [place], [address] Phone: [phone]   Fax:  [phone]  Name: Allison Goodman MRN: [id number] Date of Birth: 1961/01/24

## 2020-05-18 ENCOUNTER — Telehealth: Payer: Self-pay | Admitting: Orthopaedic Surgery

## 2020-05-18 ENCOUNTER — Other Ambulatory Visit (HOSPITAL_COMMUNITY): Payer: Self-pay | Admitting: Adult Health

## 2020-05-18 DIAGNOSIS — Z1231 Encounter for screening mammogram for malignant neoplasm of breast: Secondary | ICD-10-CM

## 2020-05-18 NOTE — Telephone Encounter (Signed)
Patient called and was told by her short term disabiltiy that they have not received a fax from the office. I let her know it was faxed again. FYI.

## 2020-05-19 ENCOUNTER — Encounter: Payer: Self-pay | Admitting: Physical Therapy

## 2020-05-19 ENCOUNTER — Other Ambulatory Visit: Payer: Self-pay

## 2020-05-19 ENCOUNTER — Ambulatory Visit: Payer: BC Managed Care – PPO | Admitting: Physical Therapy

## 2020-05-19 DIAGNOSIS — M25521 Pain in right elbow: Secondary | ICD-10-CM

## 2020-05-19 DIAGNOSIS — M6281 Muscle weakness (generalized): Secondary | ICD-10-CM

## 2020-05-19 NOTE — Therapy (Addendum)
[facility] [place], [place], [address] Phone: [phone]   Fax:  [phone]  Physical Therapy Treatment PHYSICAL THERAPY DISCHARGE SUMMARY  Visits from Start of Care: 4  Current functional level related to goals / functional outcomes: See below   Remaining deficits: See goals   Education / Equipment: HEP Plan: Patient agrees to discharge.  Patient goals were met. Patient is being discharged due to meeting the stated rehab goals.  ?????    Gabriela Eves, PT, DPT   Patient Details  Name: Allison Goodman MRN: [id number] Date of Birth: [birth year] Referring Provider (PT): Sanjuana Kava, MD   Encounter Date: 05/19/2020   PT End of Session - 05/19/20 1027    Visit Number 4    Number of Visits 4    Date for PT Re-Evaluation 06/02/20    PT Start Time 7989    PT Stop Time 1121    PT Time Calculation (min) 36 min    Activity Tolerance Patient tolerated treatment well    Behavior During Therapy [facility] for tasks assessed/performed           Past Medical History:  Diagnosis Date  . Anxiety 08/14/2017  . Cellulitis 12/22/2012   Rx doxy and refer to Dr. Luna Glasgow may need hard ware removed  . Depression 12/30/2015  . Diabetes ([place]) 01/03/2016  . Elevated blood sugar   . Fatigue 12/28/2014  . Hypertension   . Irregular bleeding 12/30/2015  . Menopause 12/24/2013  . Obesity   . Papanicolaou smear of cervix with positive high risk human papilloma virus (HPV) test 08/12/2019   +HPV 16 on pap to get colpo with Dr Elonda Husky 08/28/19 As per [facility]  . PMB (postmenopausal bleeding) 12/28/2014  . Restless leg   . Seasonal allergies   . Vitamin D deficiency 01/03/2016    Past Surgical History:  Procedure Laterality Date  . ANKLE SURGERY Right    screws placed    There were no vitals filed for this visit.   Subjective Assessment - 05/19/20 1026    Subjective COVID-19 screening performed upon arrival. Patient arrived with  3/10 dull pain. Feels like therapy has helped some.    Pertinent History HTN, DM, history of ankle surgery, depression    Limitations House hold activities;Lifting    Diagnostic tests x-ray: (-) fracture    Patient Stated Goals return to PLOF; improve ability to perform job activities    Currently in Pain? Yes    Pain Score 3     Pain Location Elbow    Pain Orientation Right;Lateral    Pain Descriptors / Indicators Dull;Discomfort    Pain Type Acute pain    Pain Onset More than a month ago    Pain Frequency Intermittent              [facility] PT Assessment - 05/19/20 0001      Assessment   Medical Diagnosis Lateral epicondylitis, right elbow    Referring Provider (PT) Sanjuana Kava, MD    Onset Date/Surgical Date 04/25/20    Hand Dominance Right    Next MD Visit 05/24/2020    Prior Therapy no      Precautions   Precautions None      Restrictions   Weight Bearing Restrictions No                         [facility] Adult PT Treatment/Exercise - 05/19/20 0001      Modalities  Modalities Electrical Stimulation;Iontophoresis;Moist Heat;Ultrasound      Moist Heat Therapy   Number Minutes Moist Heat 10 Minutes    Moist Heat Location Elbow      Electrical Stimulation   Electrical Stimulation Location R lateral elbow    Electrical Stimulation Action Pre-Mod    Electrical Stimulation Parameters 80-150 hz x10 min    Electrical Stimulation Goals Pain      Ultrasound   Ultrasound Location R proximal wrist extension    Ultrasound Parameters Combo 1.5 w/cm2, 100%, 1 mhz x10 min    Ultrasound Goals Pain      Iontophoresis   Type of Iontophoresis Dexamethasone    Location Right elbow    Dose 1ml 2 of 6    Time 8      Manual Therapy   Manual Therapy Soft tissue mobilization    Soft tissue mobilization IASTW to R proximal wrist extensors to                        PT Long Term Goals - 05/19/20 1122      PT LONG TERM GOAL #1   Title Patient will be  independent with HEP    Time 4    Period Weeks    Status Achieved      PT LONG TERM GOAL #2   Title Patient will report ability to perform ADLs and home activities with right elbow pain less than or equal to 3/10    Time 4    Period Weeks    Status Achieved      PT LONG TERM GOAL #3   Title Patient will demonstrate 35+ lbs of right grip strength to improve ability to perform work activities.    Baseline 38# Met 05/16/20    Time 4    Period Weeks    Status Achieved                 Plan - 05/19/20 1139    Clinical Impression Statement Patient presented in clinic with reports of continued but lessened R elbow pain. Patient reports her job is very repetitive and has had several of the same jobs in the past. Patient has most of the pain upon waking as she lays on her or her grandson sleeping on her. IASTW session provoked a moderate redness response over the proximal R wrist extensors but patient indicates pain over the lateral epicondyle of the R humerus. Normal modalities response noted following removal of the modalities. Iontophoresis patch donned to R lateral elbow with instruction to remove in 4 hours. All goals were achieved upon assessment today.    Personal Factors and Comorbidities Time since onset of injury/illness/exacerbation;Age;Comorbidity 3+    Comorbidities HTN, DM, history of ankle surgery, depression    Examination-Activity Limitations Carry;Lift;Caring for Others    Examination-Participation Restrictions Occupation    Stability/Clinical Decision Making Stable/Uncomplicated    Rehab Potential Fair    PT Frequency 1x / week    PT Duration 4 weeks    PT Treatment/Interventions Electrical Stimulation;Cryotherapy;Ultrasound;Moist Heat;ADLs/Self Care Home Management;Iontophoresis 4mg/ml Dexamethasone;Manual techniques;Passive range of motion;Vasopneumatic Device;Patient/family education;Therapeutic exercise;Neuromuscular re-education;Therapeutic activities    PT Next  Visit Plan To see MD 05/24/2020 and continue or D/C per MD recommendation.    PT Home Exercise Plan see patient education section           Patient will benefit from skilled therapeutic intervention in order to improve the following deficits and impairments:  Decreased strength, Decreased   activity tolerance, Decreased range of motion, Pain, Impaired UE functional use, Increased edema  Visit Diagnosis: Pain in right elbow  Muscle weakness (generalized)     Problem List Patient Active Problem List   Diagnosis Date Noted  . Papanicolaou smear of cervix with positive high risk human papilloma virus (HPV) test 08/12/2019  . Encounter for gynecological examination with Papanicolaou smear of cervix 07/27/2019  . Encounter for colorectal cancer screening 07/27/2019  . Vitamin D deficiency 01/03/2016  . Diabetes ([facility]) 01/03/2016  . Depression 12/30/2015  . Irregular bleeding 12/30/2015  . PMB (postmenopausal bleeding) 12/28/2014  . Fatigue 12/28/2014  . Menopause 12/24/2013  . Swelling of limb 05/28/2013  . Leukocytosis, unspecified 03/20/2013  . Hypokalemia 03/20/2013  . Essential hypertension   . Cellulitis of right leg 03/19/2013  . Obesity 11/19/2012    Kelsey P Kennon, PTA 05/19/20 12:05 PM   [facility] [address] [place], [place], [address] Phone: [phone]   Fax:  [phone]  Name: Allison Goodman MRN: [id number] Date of Birth: 09/04/1960   

## 2020-05-23 ENCOUNTER — Ambulatory Visit (HOSPITAL_COMMUNITY): Payer: BC Managed Care – PPO

## 2020-05-24 ENCOUNTER — Encounter: Payer: Self-pay | Admitting: Orthopaedic Surgery

## 2020-05-24 ENCOUNTER — Ambulatory Visit (INDEPENDENT_AMBULATORY_CARE_PROVIDER_SITE_OTHER): Payer: BC Managed Care – PPO | Admitting: Orthopaedic Surgery

## 2020-05-24 ENCOUNTER — Other Ambulatory Visit: Payer: Self-pay

## 2020-05-24 VITALS — BP 146/103 | HR 115 | Ht 63.0 in | Wt 243.0 lb

## 2020-05-24 DIAGNOSIS — Z6841 Body Mass Index (BMI) 40.0 and over, adult: Secondary | ICD-10-CM | POA: Diagnosis not present

## 2020-05-24 DIAGNOSIS — M7711 Lateral epicondylitis, right elbow: Secondary | ICD-10-CM

## 2020-05-24 NOTE — Patient Instructions (Signed)
Return to work October 15

## 2020-05-24 NOTE — Progress Notes (Signed)
I am better  She went to PT and it really helped her right tennis elbow.  She has much less pain.  I will have her return to work October 15, the next day she is supposed to work.   Encounter Diagnoses  Name Primary?  . Lateral epicondylitis, right elbow Yes  . Body mass index 40.0-44.9, adult (HCC)   . Morbid obesity [facility])    Discharge  Return as needed.  Call if any problem.  Precautions discussed.   Electronically Signed Darreld Mclean, MD 10/12/20213:24 PM

## 2020-06-02 ENCOUNTER — Other Ambulatory Visit: Payer: Self-pay

## 2020-06-02 ENCOUNTER — Encounter (HOSPITAL_COMMUNITY): Payer: Self-pay

## 2020-06-02 ENCOUNTER — Ambulatory Visit (HOSPITAL_COMMUNITY)
Admission: RE | Admit: 2020-06-02 | Discharge: 2020-06-02 | Disposition: A | Discharge status: Home / Self Care | Payer: BC Managed Care – PPO | Source: Ambulatory Visit | Attending: Adult Health | Admitting: Adult Health

## 2020-06-02 DIAGNOSIS — Z1231 Encounter for screening mammogram for malignant neoplasm of breast: Secondary | ICD-10-CM | POA: Diagnosis not present

## 2020-08-16 ENCOUNTER — Encounter: Payer: Self-pay | Admitting: Adult Health

## 2020-08-16 ENCOUNTER — Other Ambulatory Visit (HOSPITAL_COMMUNITY)
Admission: RE | Admit: 2020-08-16 | Discharge: 2020-08-16 | Disposition: A | Discharge status: Home / Self Care | Payer: BC Managed Care – PPO | Source: Ambulatory Visit | Attending: Adult Health | Admitting: Adult Health

## 2020-08-16 ENCOUNTER — Ambulatory Visit (INDEPENDENT_AMBULATORY_CARE_PROVIDER_SITE_OTHER): Payer: BC Managed Care – PPO | Admitting: Adult Health

## 2020-08-16 ENCOUNTER — Other Ambulatory Visit: Payer: Self-pay

## 2020-08-16 VITALS — BP 137/87 | HR 84 | Ht 63.0 in | Wt 234.0 lb

## 2020-08-16 DIAGNOSIS — Z01419 Encounter for gynecological examination (general) (routine) without abnormal findings: Secondary | ICD-10-CM

## 2020-08-16 DIAGNOSIS — R87810 Cervical high risk human papillomavirus (HPV) DNA test positive: Secondary | ICD-10-CM | POA: Diagnosis not present

## 2020-08-16 DIAGNOSIS — Z1212 Encounter for screening for malignant neoplasm of rectum: Secondary | ICD-10-CM

## 2020-08-16 DIAGNOSIS — Z1211 Encounter for screening for malignant neoplasm of colon: Secondary | ICD-10-CM

## 2020-08-16 DIAGNOSIS — N950 Postmenopausal bleeding: Secondary | ICD-10-CM | POA: Diagnosis not present

## 2020-08-16 LAB — HEMOCCULT GUIAC POC 1CARD (OFFICE): Fecal Occult Blood, POC: NEGATIVE

## 2020-08-16 NOTE — Progress Notes (Signed)
Patient ID: Allison Goodman, female   DOB: 20-May-1961, 60 y.o.   MRN: [id number] History of Present Illness:  Allison Goodman is a 60 year old white female, divorced, ?PM in for well woman gyn exam and pap. She had +HPV 16 and HRHPV on pap 07/25/2019, negative for malignancy, with normal colpo 08/28/19. She says she is taking megace every 3 months for 10 days per Dr Despina Hidden and has no bleeding when does not take but in May had black blood but in November had red blood then black and had pain and is having hot flashes and not sleeping well, she has history of DVT. She has lost 18 lbs since 07/2019.  [name]. No current PCP, she says Dr Sudie Bailey would no longer see her since she did not get COVID vaccine.  Current Medications, Allergies, Past Medical History, Past Surgical History, Family History and Social History were reviewed in [employer] record.     Review of Systems: Patient denies any headaches, hearing loss, fatigue, blurred vision, shortness of breath, chest pain, abdominal pain, problems with bowel movements, urination, or intercourse.(not active) No joint pain or mood swings. See HPI for positives.   Physical Exam:BP 137/87 (BP Location: Left Arm, Patient Position: Sitting, Cuff Size: Normal)   Pulse 84   Ht 5\' 3"  (1.6 m)   Wt 234 lb (106.1 kg)   BMI 41.45 kg/m  General:  Well developed, well nourished, no acute distress Skin:  Warm and dry Neck:  Midline trachea, normal thyroid, good ROM, no lymphadenopathy Lungs; Clear to auscultation bilaterally Breast:  No dominant palpable mass, retraction, or nipple discharge Cardiovascular: Regular rate and rhythm Abdomen:  Soft, non tender, no hepatosplenomegaly Pelvic:  External genitalia is normal in appearance, no lesions.  The vagina is normal in appearance. Urethra has no lesions or masses. The cervix is bulbous,pap with HRHPV performed.  Uterus is felt to be normal size, shape, and contour.  No  adnexal masses or tenderness noted.Bladder is non tender, no masses felt. Rectal: Good sphincter tone, no polyps, or hemorrhoids felt.  Hemoccult negative. Extremities/musculoskeletal:  No swelling, no clubbing or cyanosis has poor circulation in RLE Psych:  No mood changes, alert and cooperative,seems happy AA is 1 Fall risk is low PHQ 9 score is 9, no SI on effexor and ativan GAD 7 score is 10  Upstream - 08/16/20 1536      Pregnancy Intention Screening   Does the patient want to become pregnant in the next year? No    Does the patient's partner want to become pregnant in the next year? No    Would the patient like to discuss contraceptive options today? No      Contraception Wrap Up   Current Method No Method - Other Reason   not active and PM   End Method No Method - Other Reason   not active and PM   Contraception Counseling Provided No         Examination chaperoned by 10/14/20 LPN   Impression and Plan: 1. Encounter for gynecological examination with Papanicolaou smear of cervix Pap sent Physical in 1 year Pap in 3 years if normal Mammogram yearly She declines COVID vaccine Encouraged to continue to work on weight   2. Encounter for screening fecal occult blood testing   3. Cervical high risk HPV (human papillomavirus) test positive, HPV 16 Pap sent   4. PMB (postmenopausal bleeding) Will get pelvic Faith Rogue 08/22/20 at [facility]  at 9:30 am to assess uterus She would like to stop megace   5. Screening for colorectal cancer Referred to [facility] for screening colonoscopy

## 2020-08-18 ENCOUNTER — Encounter: Payer: Self-pay | Admitting: Internal Medicine

## 2020-08-21 LAB — CYTOLOGY - PAP
Adequacy: ABSENT
Comment: NEGATIVE
Diagnosis: NEGATIVE
High risk HPV: NEGATIVE

## 2020-08-22 ENCOUNTER — Other Ambulatory Visit: Payer: Self-pay

## 2020-08-22 ENCOUNTER — Ambulatory Visit (HOSPITAL_COMMUNITY)
Admission: RE | Admit: 2020-08-22 | Discharge: 2020-08-22 | Disposition: A | Discharge status: Home / Self Care | Payer: BC Managed Care – PPO | Source: Ambulatory Visit | Attending: Adult Health | Admitting: Adult Health

## 2020-08-22 DIAGNOSIS — N950 Postmenopausal bleeding: Secondary | ICD-10-CM | POA: Diagnosis not present

## 2020-09-15 ENCOUNTER — Other Ambulatory Visit: Payer: BC Managed Care – PPO | Admitting: Obstetrics & Gynecology

## 2020-09-16 ENCOUNTER — Ambulatory Visit (INDEPENDENT_AMBULATORY_CARE_PROVIDER_SITE_OTHER): Payer: BC Managed Care – PPO | Admitting: Obstetrics & Gynecology

## 2020-09-16 ENCOUNTER — Encounter: Payer: Self-pay | Admitting: Obstetrics & Gynecology

## 2020-09-16 ENCOUNTER — Other Ambulatory Visit: Payer: Self-pay

## 2020-09-16 VITALS — BP 150/92 | HR 105 | Ht 63.0 in | Wt 240.0 lb

## 2020-09-16 DIAGNOSIS — R9389 Abnormal findings on diagnostic imaging of other specified body structures: Secondary | ICD-10-CM | POA: Diagnosis not present

## 2020-09-16 MED ORDER — MEGESTROL ACETATE 40 MG PO TABS: ORAL_TABLET | ORAL | 3 refills | Status: DC

## 2020-09-16 NOTE — Progress Notes (Signed)
Follow up appointment for results  Chief Complaint  Patient presents with  . Endometrial Biopsy    Thickened endometrium    Blood pressure (!) 150/92, pulse (!) 105, height 5\' 3"  (1.6 m), weight 240 lb (108.9 kg).      CLINICAL DATA:  Postmenopausal bleeding, G2P2, postmenopausal since 2011  EXAM: TRANSABDOMINAL AND TRANSVAGINAL ULTRASOUND OF PELVIS  TECHNIQUE: Both transabdominal and transvaginal ultrasound examinations of the pelvis were performed. Transabdominal technique was performed for global imaging of the pelvis including uterus, ovaries, adnexal regions, and pelvic cul-de-sac. It was necessary to proceed with endovaginal exam following the transabdominal exam to visualize the uterus, endometrium, and ovaries.  COMPARISON:  01/16/2016  FINDINGS: Uterus  Measurements: 7.2 x 4.0 x 3.5 cm = volume: 54 mL. Anteverted. Heterogeneous myometrium. Suboptimal visualization of fundus due to shadowing. Nabothian cysts at cervix. No definite uterine mass  Endometrium  Thickness: 7 mm.  No endometrial fluid or focal abnormality  Right ovary  Not visualized, likely obscured by bowel  Left ovary  Measurements: 1.7 x 1.2 x 1.6 cm = volume: 1.8 mL. Normal morphology without mass  Other findings  No free pelvic fluid.  No adnexal masses.  IMPRESSION: Nonvisualization of RIGHT ovary.  thickened endometrial complex 7 mm thick, abnormal for a postmenopausal patient with bleeding; in the setting of post-menopausal bleeding, endometrial sampling is indicated to exclude carcinoma. If results are benign, sonohysterogram should be considered for focal lesion work-up. (Ref: Radiological Reasoning: Algorithmic Workup of Abnormal Vaginal Bleeding with Endovaginal Sonography and Sonohysterography. AJR [id number])   Electronically Signed   By: ; [id number] M.D.   On: 08/22/2020 10:48     MEDS ordered this encounter: Meds ordered this encounter   Medications  . megestrol (MEGACE) 40 MG tablet    Sig: 1 TABLET DAILY    Dispense:  30 tablet    Refill:  3    Orders for this encounter: No orders of the defined types were placed in this encounter.   Impression: 1.    Discussion and Plan: To fully recap this patient's clinical status and management over the past 4-1/2 years I will provide a summary of her postmenopausal bleeding management up to this point  This patient was seen in June 2017 for perimenopausal bleeding And FSH was performed and was 54.8 An endometrial stripe was performed and was 8 mm She had a significant amount of bleeding Endometrial biopsy was performed and was benign revealing no atypia hyperplasia or malignancy I then placed her own monthly Provera 10 mg for 10 days and she continued to have cyclical bleeding up to 8 days at a time Between December 2017 and February 2019 her volume of bleeding with each cyclical withdrawal came less and less, however she continued to have predictable withdrawal bleeding on a monthly basis, at this point as of 09/2017 it was brown and light pink At that point I decided to change her to every 86-month cycling since the volume of bleeding was dramatically less, but to continue the cyclical Provera to suppress what was ongoing endometrial stimulation We didn't really hear from the patient again until January 2020 when she experienced very heavy bleeding again predictably and withdrawal but much heavier At that time I put her own high-dose megestrol algorithm to stop her bleeding which indeed it did I then placed her on 20 mg of megestrol daily for 3 months She had no bleeding on this regimen and we continued it an additional 6 months from June  until December 2020 We then began cyclical megestrol 40 mg 10 days every 3 months which is now been approximately 15 months total and she has scant bleeding after each cyclical ministration of the megestrol As per the schedule her last  cyclical megestrol was November 1 through November 10  She then saw Cyril Mourning in January 2022 and reported she was having predictable withdrawal spotting bleeding after her megestrol cycles At that point a sonogram was obtained to evaluate endometrial stripe which was 7 mm The ultrasound was on 08/19/2020 which was a full 2 months since her last megestrol administration  She then set her up to see me today for evaluation of entertaining an endometrial biopsy  As noted as far back as June 2017 this patient has ongoing endometrial stimulation probably from the peripheral production of estrogen We have managed it well over the past 4-1/2 years with cyclical Provera monthly now advancing all the way to cyclical megestrol every 3 months The fact that she has a 7 mm endometrial stripe is not surprising and is the whole reason why she is on cyclical progesterone therapy Being on cyclical progesterone therapy meaning she is having appropriate interval treatment for her slow but sure endometrial development means she does not require additional endometrial sampling While the patient would like to stop the megestrol I reinforced with her today the fact that she is on cyclical progesterone in this case megestrol in order to have ongoing suppression of the endometrium which again is going to be needed as long as she continues to have withdrawal spotting and/or bleeding Since she had a benign endometrium has been own progestational suppression appropriately since that time additional endometrial sampling is not required This does not represent an abnormal endometrial thickness in the postmenopausal state in a woman who continues to undergo cyclical progesterone therapy and management The patient understands this agrees with this management and will continue her cyclical Provera every 3 months She is due to start it in February and she will start it tonight She understands she will take it every 3 months for  10 days and call should she have any interval bleeding otherwise She understands she may be on this quite a long time depending on her ongoing endometrial development which will be evidenced by withdrawal bleeding  Again to recap this is technically not postmenopausal bleeding because she is being cycled with progesterone provide ongoing suppression  While she is postmenopausal by ovarian lab work she does continue endometrial stimulation which we are appropriately suppressing with the cyclical progesterone   Follow Up: Return if symptoms worsen or fail to improve.       Face to face time:  20 minutes  Greater than 50% of the visit time was spent in counseling and coordination of care with the patient.  The summary and outline of the counseling and care coordination is summarized in the note above.   All questions were answered.  Past Medical History:  Diagnosis Date  . Anxiety 08/14/2017  . Cellulitis 12/22/2012   Rx doxy and refer to Dr. Hilda Lias may need hard ware removed  . Depression 12/30/2015  . Diabetes (HCC) 01/03/2016  . Elevated blood sugar   . Fatigue 12/28/2014  . Hypertension   . Irregular bleeding 12/30/2015  . Menopause 12/24/2013  . Obesity   . Papanicolaou smear of cervix with positive high risk human papilloma virus (HPV) test 08/12/2019   +HPV 16 on pap to get colpo with Dr Despina Hidden 08/28/19 As per  [facility]  . PMB (postmenopausal bleeding) 12/28/2014  . Restless leg   . Seasonal allergies   . Vitamin D deficiency 01/03/2016    Past Surgical History:  Procedure Laterality Date  . ANKLE SURGERY Right    screws placed    OB History    Gravida  2   Para  2   Term  2   Preterm      AB      Living  2     SAB      IAB      Ectopic      Multiple      Live Births  2           Allergies  Allergen Reactions  . Morphine And Related Nausea And Vomiting    Social History   Socioeconomic History  . Marital status: Divorced    Spouse name: Not  on file  . Number of children: 2  . Years of education: Not on file  . Highest education level: Not on file  Occupational History  . Not on file  Tobacco Use  . Smoking status: Never Smoker  . Smokeless tobacco: Never Used  Vaping Use  . Vaping Use: Never used  Substance and Sexual Activity  . Alcohol use: No  . Drug use: No  . Sexual activity: Not Currently    Birth control/protection: None, Post-menopausal  Other Topics Concern  . Not on file  Social History Narrative  . Not on file   Social Determinants of Health   Financial Resource Strain: Medium Risk  . Difficulty of Paying Living Expenses: Somewhat hard  Food Insecurity: No Food Insecurity  . Worried About Programme researcher, broadcasting/film/video in the Last Year: Never true  . Ran Out of Food in the Last Year: Never true  Transportation Needs: No Transportation Needs  . Lack of Transportation (Medical): No  . Lack of Transportation (Non-Medical): No  Physical Activity: Insufficiently Active  . Days of Exercise per Week: 1 day  . Minutes of Exercise per Session: 30 min  Stress: Stress Concern Present  . Feeling of Stress : Rather much  Social Connections: Socially Isolated  . Frequency of Communication with Friends and Family: More than three times a week  . Frequency of Social Gatherings with Friends and Family: Twice a week  . Attends Religious Services: Never  . Active Member of Clubs or Organizations: No  . Attends Banker Meetings: Never  . Marital Status: Divorced    Family History  Problem Relation Age of Onset  . COPD Mother   . Diabetes Mother   . Hyperlipidemia Mother   . Hypertension Mother   . Parkinson's disease Father   . Diabetes Brother   . Hyperlipidemia Brother   . Mitral valve prolapse Sister   . Diabetes Brother   . Hyperlipidemia Brother   . Diabetes Sister   . Hyperlipidemia Sister   . Hypertension Sister   . Heart disease Sister        before age 44  . Cancer Maternal Grandmother    . Breast cancer Sister   . Breast cancer Maternal Aunt   . Breast cancer Paternal Aunt

## 2020-09-26 ENCOUNTER — Ambulatory Visit: Payer: BC Managed Care – PPO | Admitting: Gastroenterology

## 2020-09-30 ENCOUNTER — Ambulatory Visit: Payer: BC Managed Care – PPO | Admitting: Gastroenterology

## 2020-10-03 IMAGING — MG MM DIGITAL SCREENING BILAT W/ TOMO W/ CAD
6 of 10 series · 6 of 30 positions shown · non-contrast
Comparison: Previous exam(s).

CLINICAL DATA: Screening.

EXAM:
DIGITAL SCREENING BILATERAL MAMMOGRAM WITH TOMO AND CAD

[L MLO synth-2D]
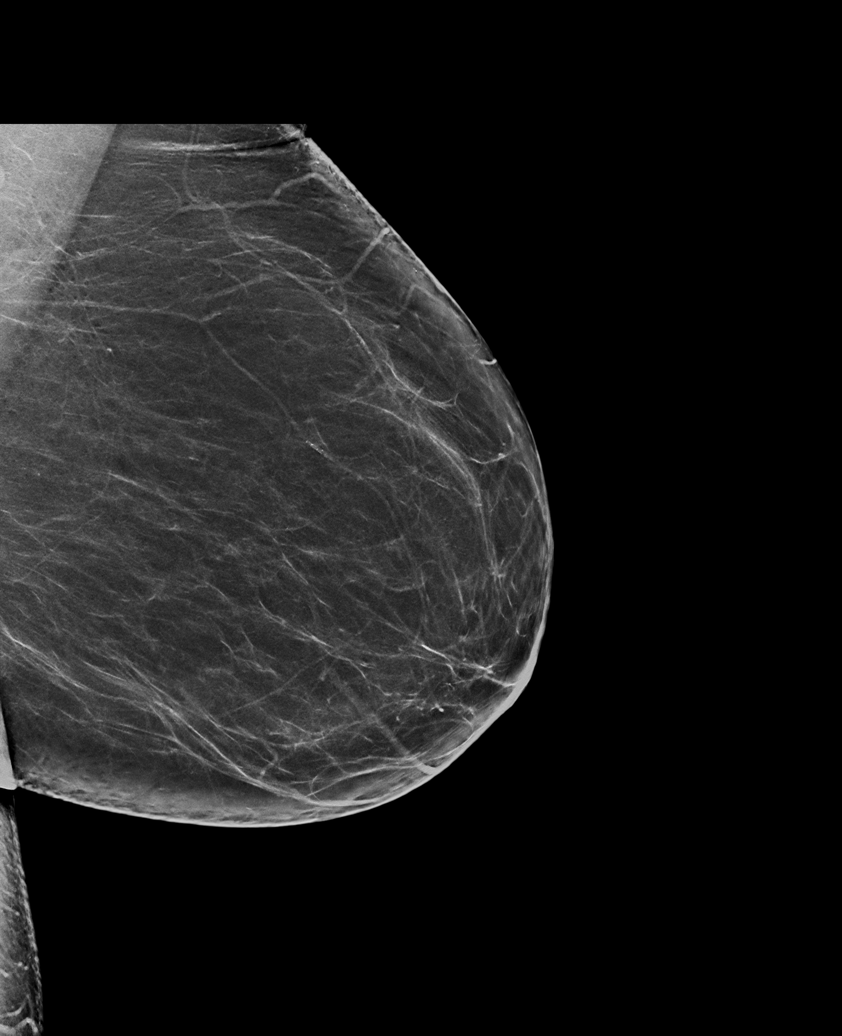

[R CC synth-2D]
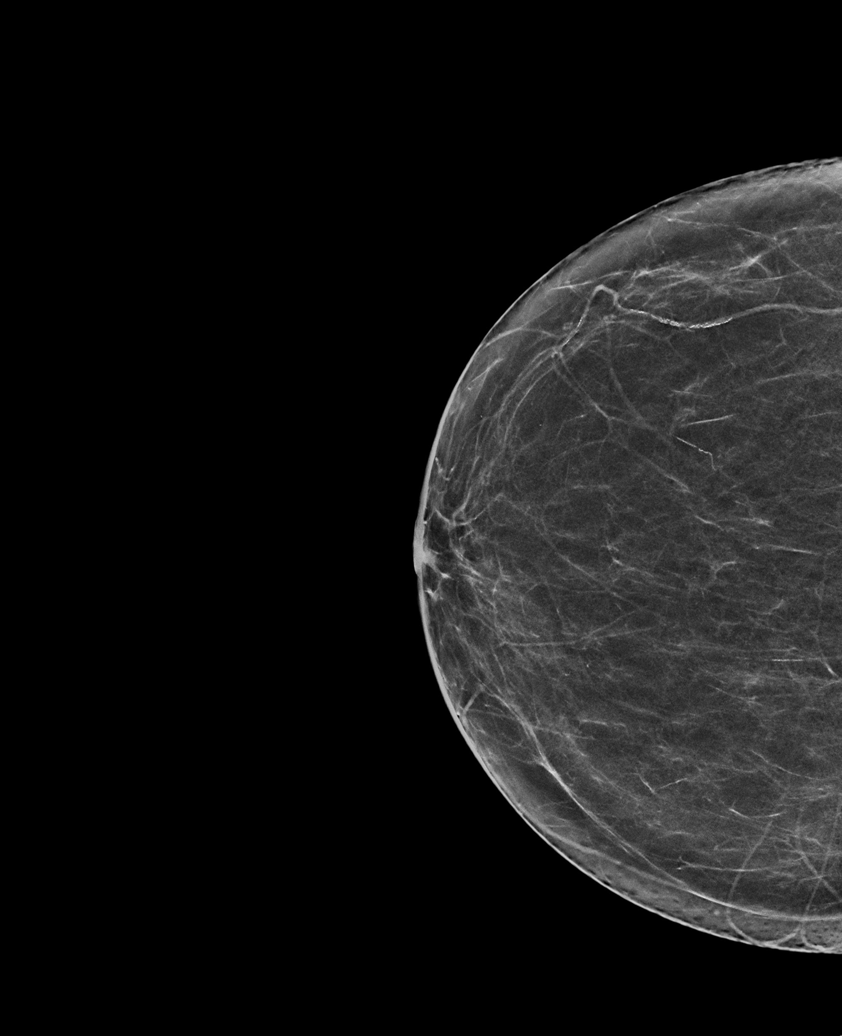

[R MLO synth-2D (1 of 2)]
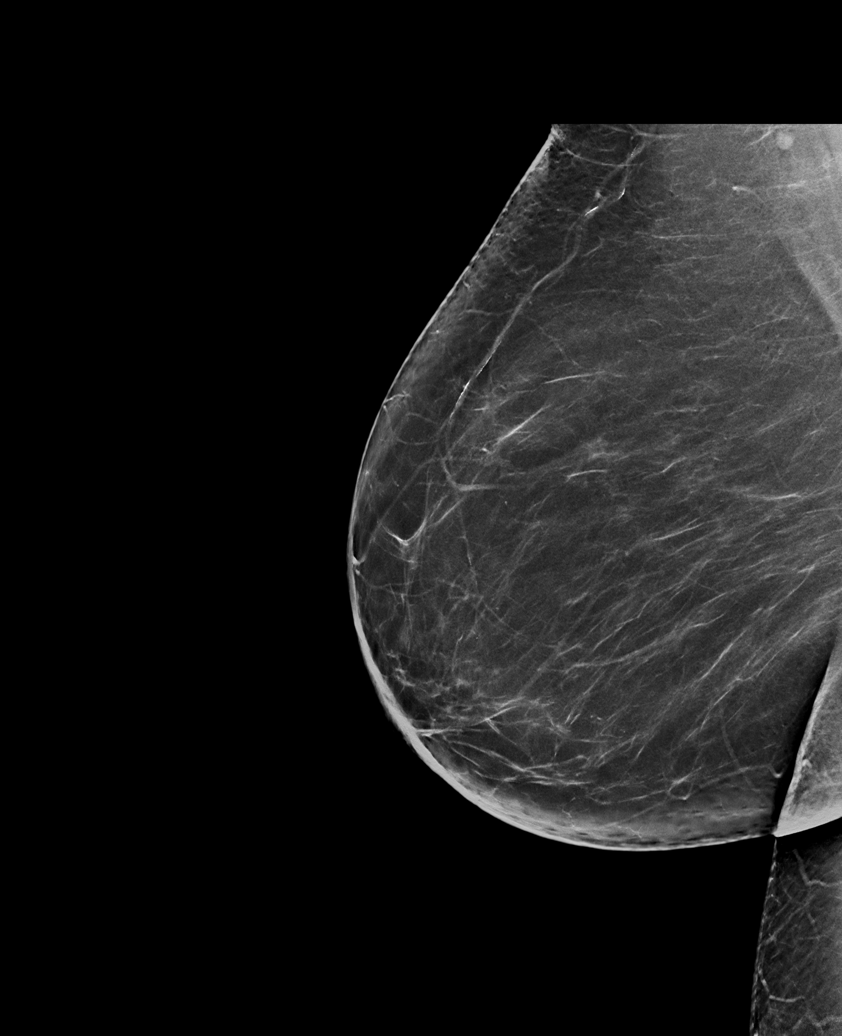

[R MLO synth-2D (2 of 2)]
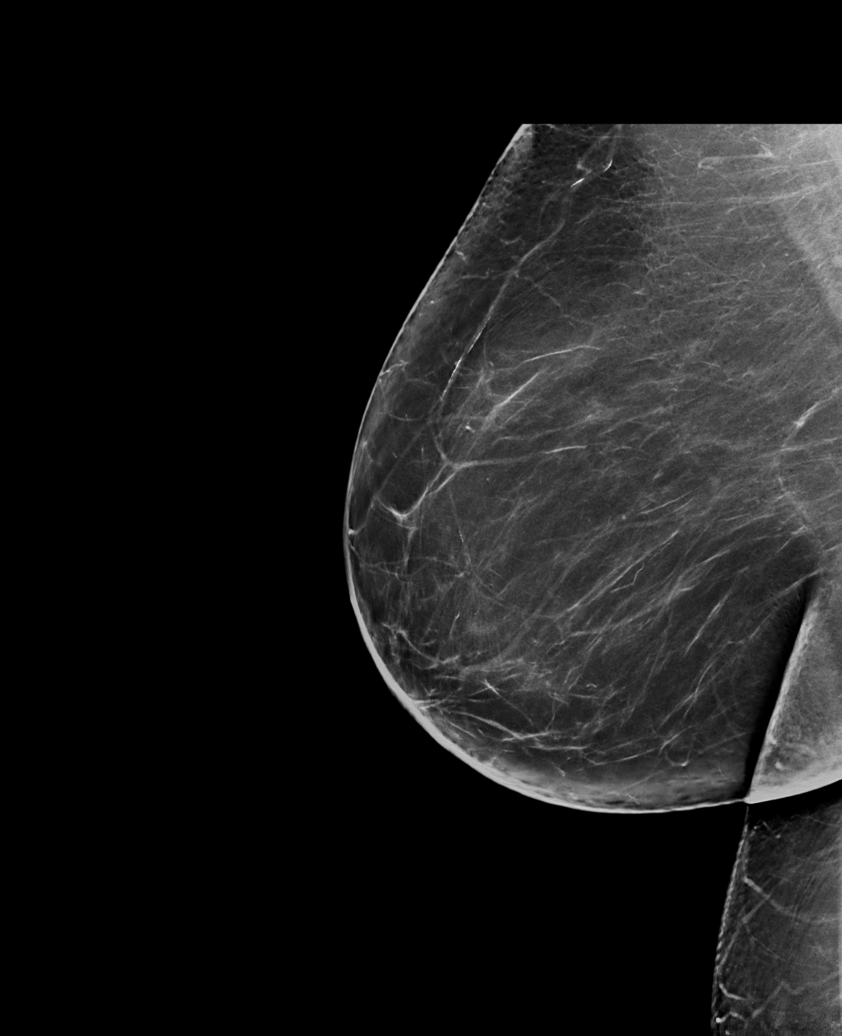

[L CC synth-2D]
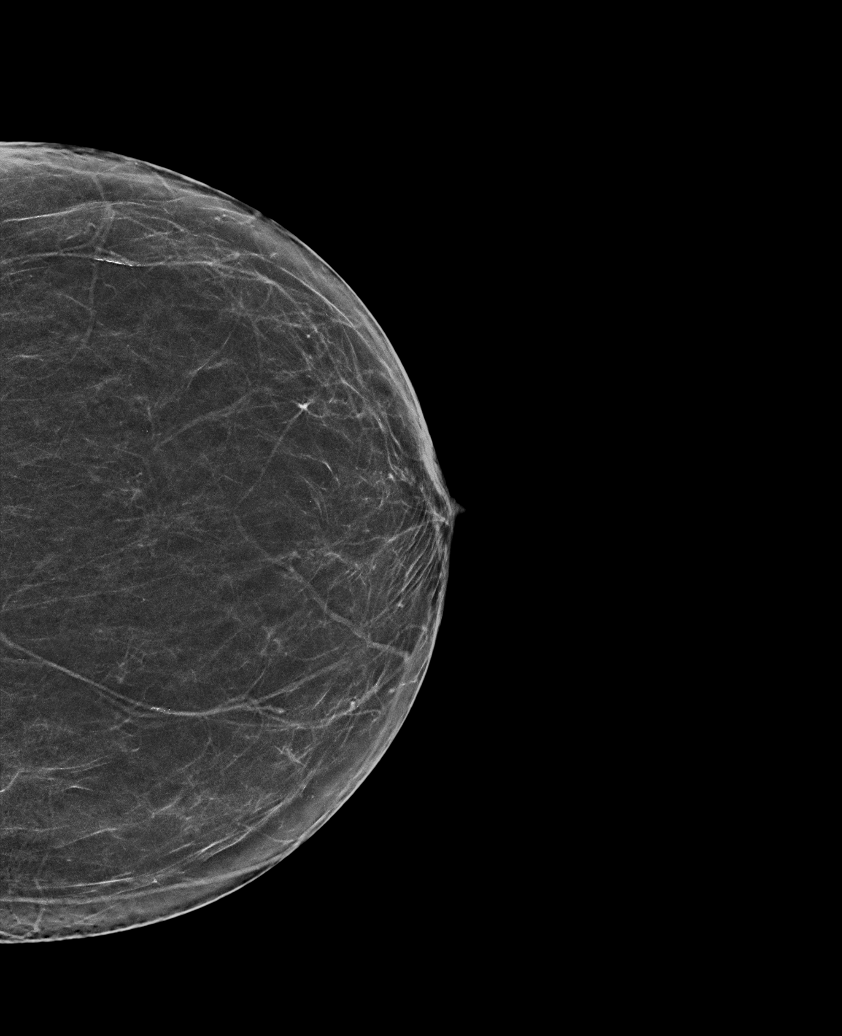

[R MLO tomo · tomo slice 43/84.0]
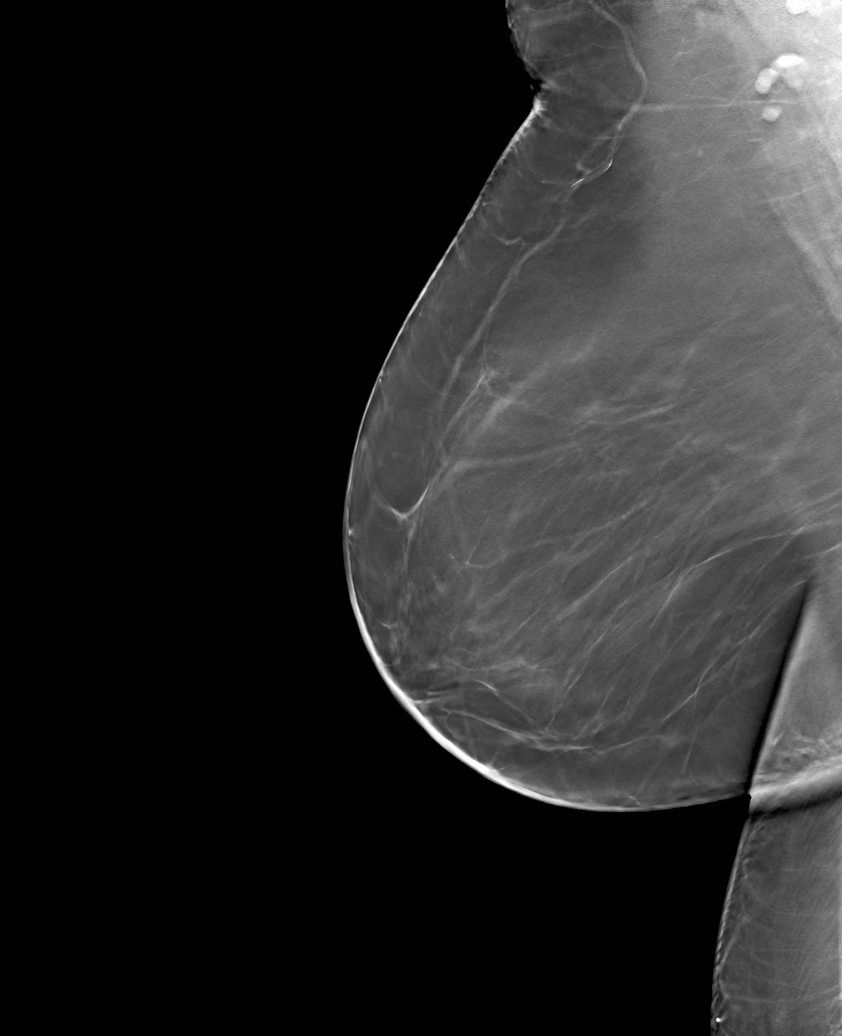

[6 of 30 positions shown; findings below may reference images not displayed]

ACR Breast Density Category b: There are scattered areas of
fibroglandular density.
FINDINGS: There are no findings suspicious for malignancy. Images were
processed with CAD.
IMPRESSION: No mammographic evidence of malignancy. A result letter of this
screening mammogram will be mailed directly to the patient.

RECOMMENDATION:
Screening mammogram in one year. (Code:[id number]-775)

BI-RADS CATEGORY  1: Negative.

## 2020-10-03 IMAGING — MR MR FOOT*L* WO/W CM
4 of 9 series · 18 of 40 positions shown · IV contrast (10ml gadavist)
Comparison: None.

CLINICAL DATA: Left foot pain for 2 months. Sprain of the tarsal
metatarsal ligament of the left foot.

EXAM:
MRI OF THE LEFT FOREFOOT WITHOUT AND WITH CONTRAST
TECHNIQUE: Multiplanar, multisequence MR imaging of the left midfoot and
forefoot was performed both before and after administration of
intravenous contrast. This study is limited due to the patient's
inability to tolerate further imaging. However, I think that the
study is diagnostic.
CONTRAST:  10mL GADAVIST GADOBUTROL 1 MMOL/ML IV SOLN

[Series 5: T1 · oblique · 4.0mm · 0.26mm/px · 6 of 33 slices shown (1 of 2)]
[im 1/33]
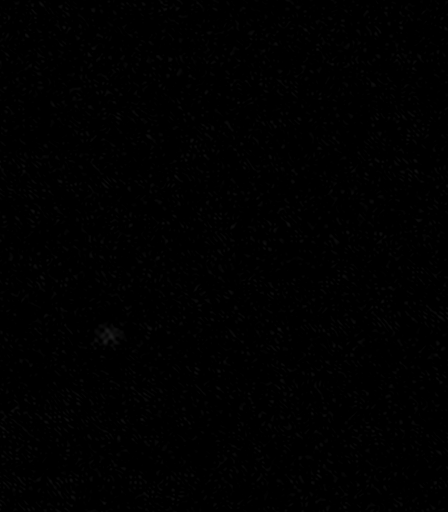
[im 7/33]
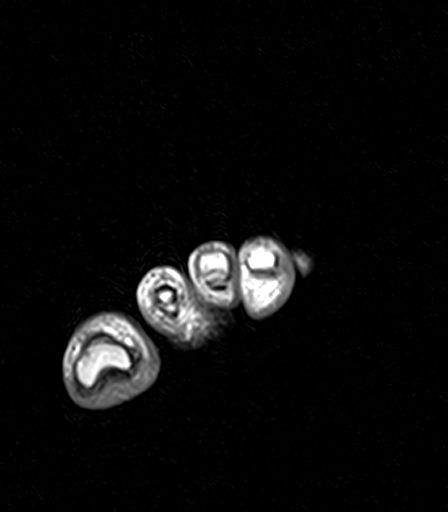
[im 13/33]
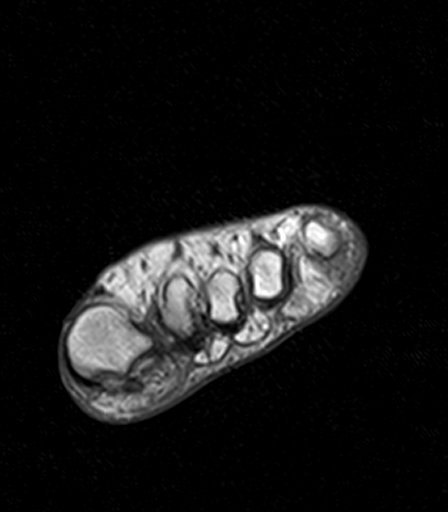
[im 20/33]
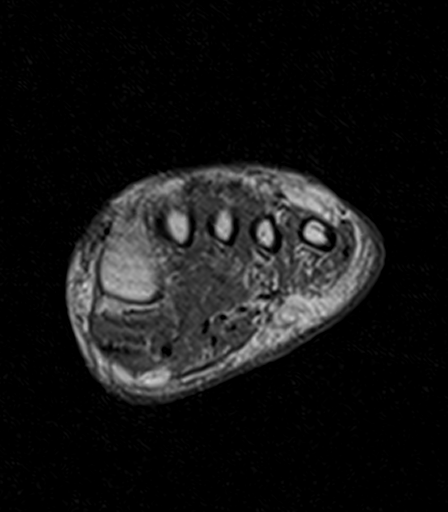
[im 26/33]
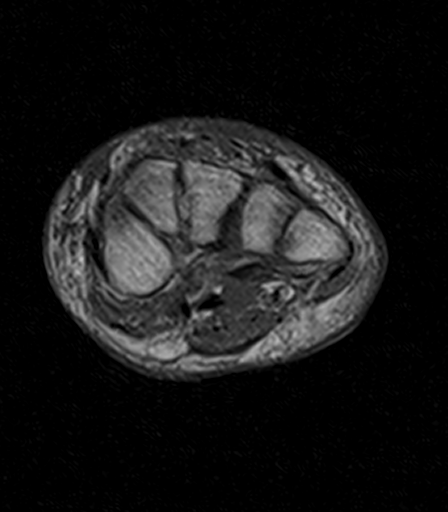
[im 33/33]
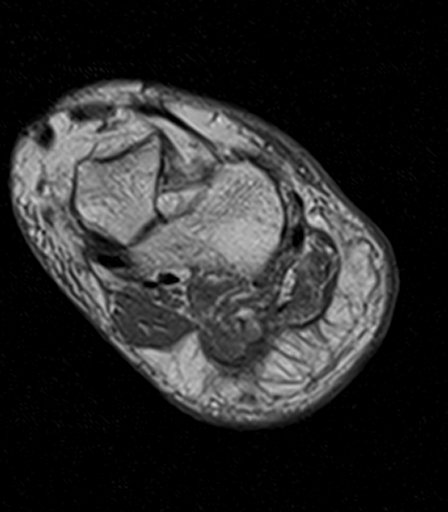

[Series 6: t2fs axial · oblique · 4.0mm · 0.26mm/px · 6 of 33 slices shown]
[im 1/33]
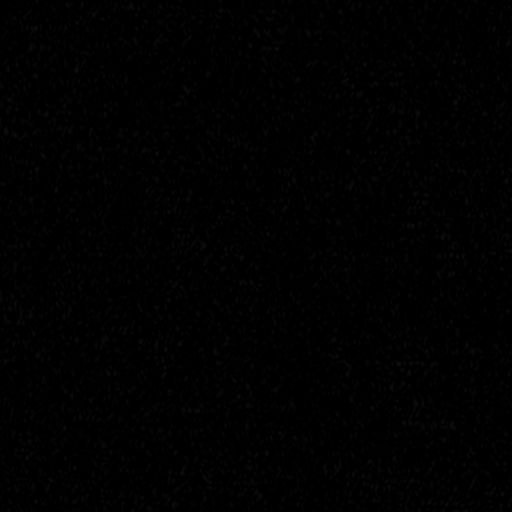
[im 7/33]
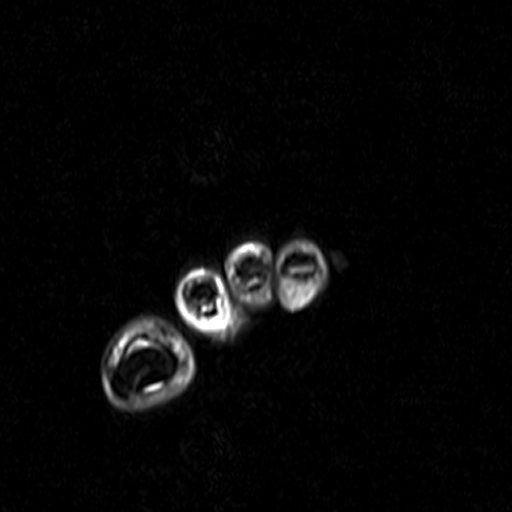
[im 13/33]
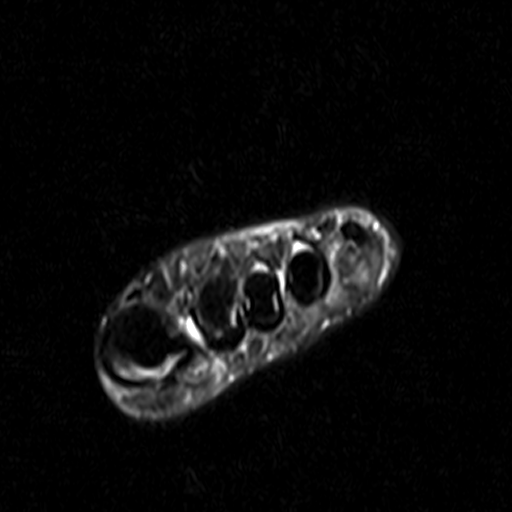
[im 20/33]
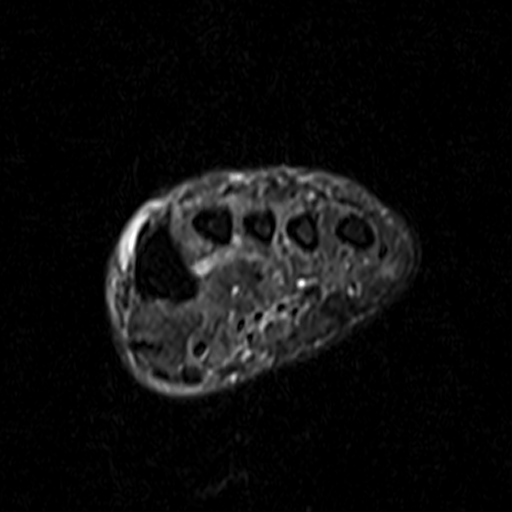
[im 26/33]
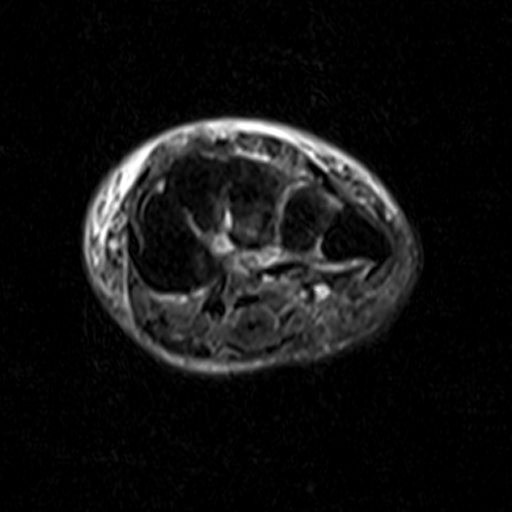
[im 33/33]
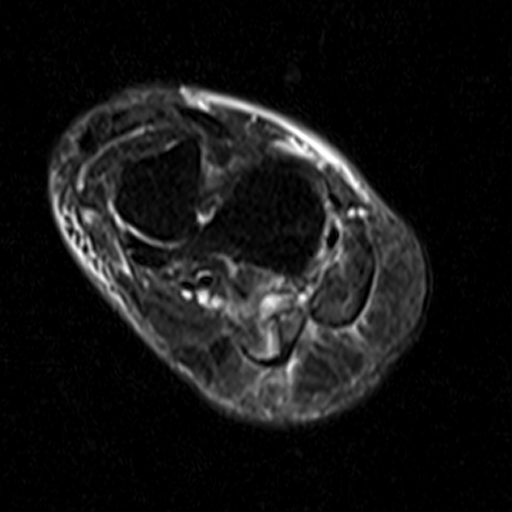

[Series 7: t1fs axial · oblique · 3.0mm · 0.21mm/px · 4 of 30 slices shown]
[im 1/30]
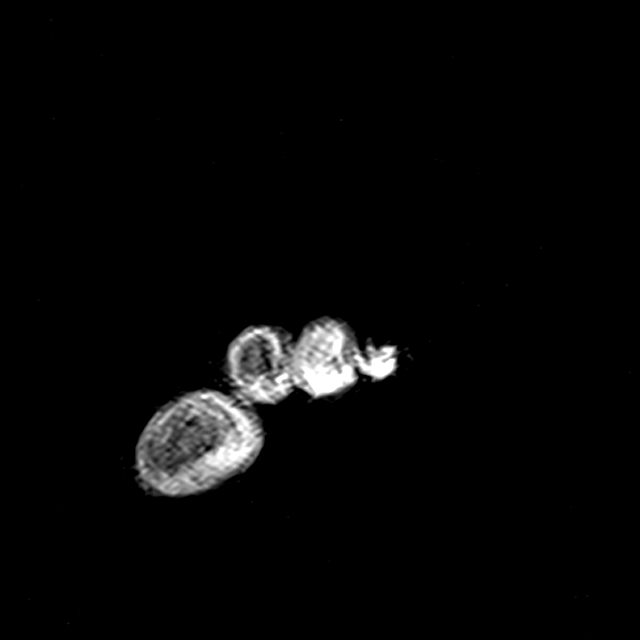
[im 6/30]
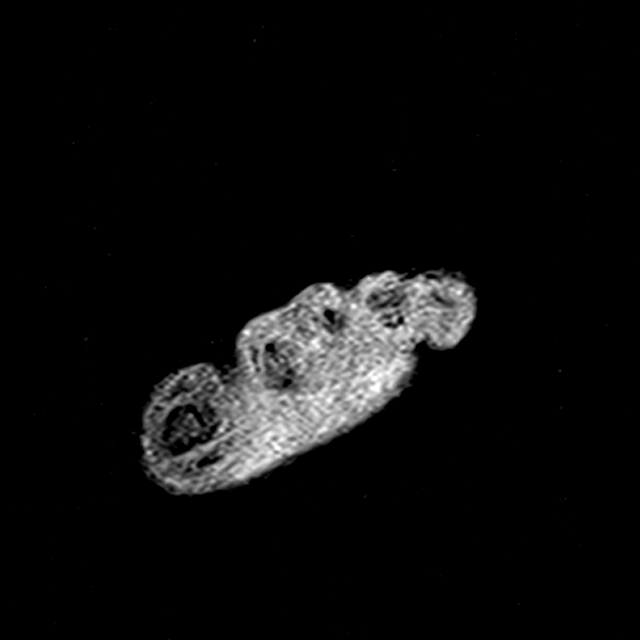
[im 18/30]
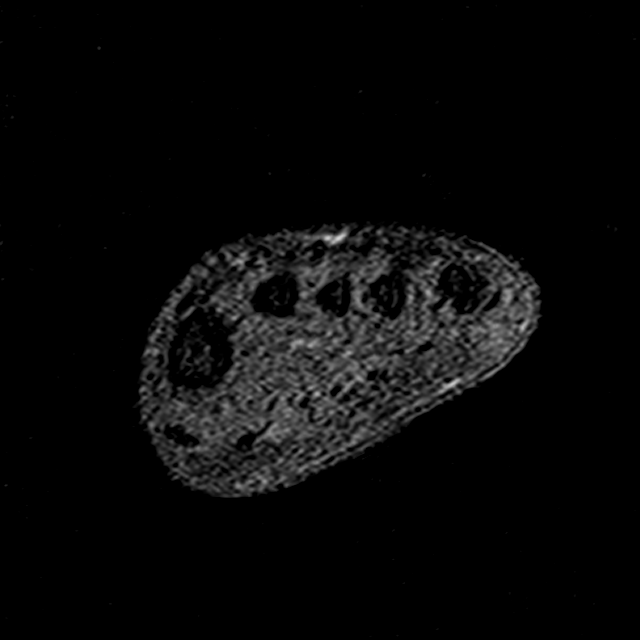
[im 30/30]
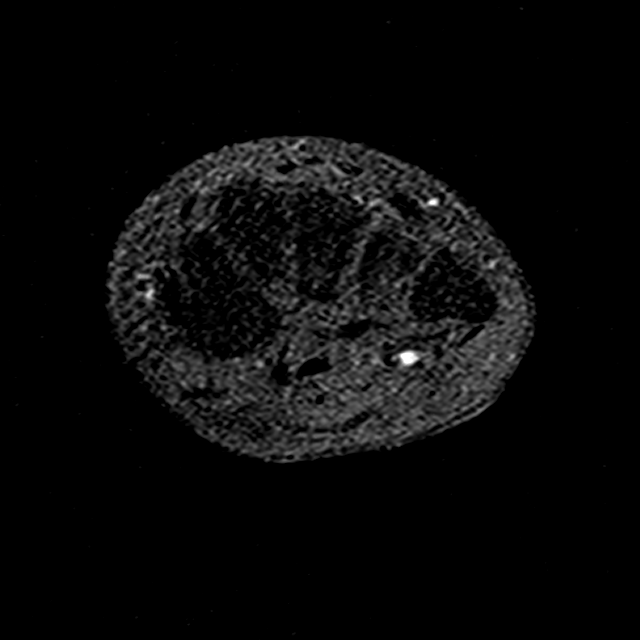

[Series 8: T1 · oblique · 4.0mm · 0.35mm/px · 2 of 13 slices shown (2 of 2)]
[im 1/13]
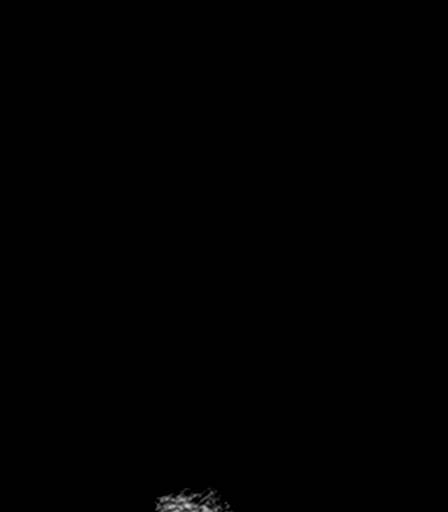
[im 13/13]
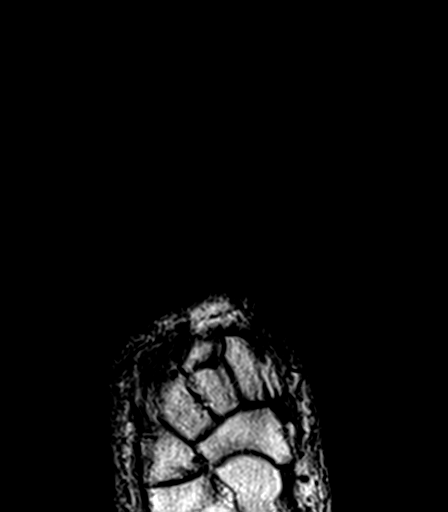

[18 of 40 positions shown; findings below may reference images not displayed]

FINDINGS: Bones/Joint/Cartilage

The bones appear normal.

Ligaments

No discrete ligamentous disruption. Specifically, there is no
evidence to suggest injury to the Lisfranc ligament.

Muscles and Tendons

There is slight enhancement of the soft tissues around the proximal
shaft of the third metatarsal, nonspecific.

Soft tissues

There is adventitial bursitis at the plantar aspect of the head of
the fifth metatarsal. This is adventitial bursitis enhances
inhomogeneously but intensely after contrast administration.
IMPRESSION: 1. Adventitial bursitis at the plantar aspect of the head of the
fifth metatarsal.
2. Slight nonspecific enhancement around the proximal shaft of the
third metatarsal. The bone appears normal.
3. No findings to suggest injury to the Lisfranc ligament.

## 2021-02-10 ENCOUNTER — Telehealth: Payer: Self-pay | Admitting: Orthopaedic Surgery

## 2021-03-06 ENCOUNTER — Encounter: Payer: Self-pay | Admitting: Orthopedic Surgery

## 2021-03-09 ENCOUNTER — Ambulatory Visit: Payer: BC Managed Care – PPO | Admitting: Orthopaedic Surgery

## 2021-05-26 ENCOUNTER — Other Ambulatory Visit (HOSPITAL_COMMUNITY): Payer: Self-pay | Admitting: Adult Health

## 2021-05-26 DIAGNOSIS — Z1231 Encounter for screening mammogram for malignant neoplasm of breast: Secondary | ICD-10-CM

## 2021-06-14 ENCOUNTER — Ambulatory Visit (HOSPITAL_COMMUNITY): Payer: BC Managed Care – PPO

## 2021-07-02 ENCOUNTER — Other Ambulatory Visit: Payer: Self-pay

## 2021-07-02 DIAGNOSIS — M7989 Other specified soft tissue disorders: Secondary | ICD-10-CM

## 2021-07-07 ENCOUNTER — Ambulatory Visit (HOSPITAL_COMMUNITY): Payer: BC Managed Care – PPO

## 2021-07-24 ENCOUNTER — Ambulatory Visit (HOSPITAL_COMMUNITY): Payer: BC Managed Care – PPO

## 2021-08-01 ENCOUNTER — Ambulatory Visit (HOSPITAL_COMMUNITY): Payer: BC Managed Care – PPO

## 2021-08-02 ENCOUNTER — Other Ambulatory Visit: Payer: Self-pay

## 2021-08-02 ENCOUNTER — Ambulatory Visit (HOSPITAL_COMMUNITY): Payer: BC Managed Care – PPO

## 2021-08-02 ENCOUNTER — Ambulatory Visit (HOSPITAL_COMMUNITY)
Admission: RE | Admit: 2021-08-02 | Discharge: 2021-08-02 | Disposition: A | Discharge status: Home / Self Care | Payer: BC Managed Care – PPO | Source: Ambulatory Visit | Attending: Adult Health | Admitting: Adult Health

## 2021-08-02 DIAGNOSIS — Z1231 Encounter for screening mammogram for malignant neoplasm of breast: Secondary | ICD-10-CM | POA: Insufficient documentation

## 2021-09-01 ENCOUNTER — Other Ambulatory Visit: Payer: Self-pay

## 2021-09-01 ENCOUNTER — Encounter: Payer: Self-pay | Admitting: Adult Health

## 2021-09-01 ENCOUNTER — Ambulatory Visit (INDEPENDENT_AMBULATORY_CARE_PROVIDER_SITE_OTHER): Payer: BC Managed Care – PPO | Admitting: Adult Health

## 2021-09-01 VITALS — BP 118/75 | HR 104 | Ht 63.0 in | Wt 247.0 lb

## 2021-09-01 DIAGNOSIS — R9389 Abnormal findings on diagnostic imaging of other specified body structures: Secondary | ICD-10-CM | POA: Diagnosis not present

## 2021-09-01 DIAGNOSIS — Z1211 Encounter for screening for malignant neoplasm of colon: Secondary | ICD-10-CM | POA: Insufficient documentation

## 2021-09-01 DIAGNOSIS — Z01419 Encounter for gynecological examination (general) (routine) without abnormal findings: Secondary | ICD-10-CM | POA: Insufficient documentation

## 2021-09-01 LAB — HEMOCCULT GUIAC POC 1CARD (OFFICE): Fecal Occult Blood, POC: NEGATIVE

## 2021-09-01 MED ORDER — MEGESTROL ACETATE 40 MG PO TABS: ORAL_TABLET | ORAL | 4 refills | Status: DC

## 2021-09-01 NOTE — Progress Notes (Signed)
Patient ID: Allison Goodman, female   DOB: August 26, 1960, 61 y.o.   MRN: [id number] History of Present Illness: Allison Goodman is a 61 year old white female, divorced, PM in for a well woman gyn exam. She is still taking Megace 10 days every 3 months. She did have brown discharge in May. PCP is Allison Huh NP at [facility].   Lab Results  Component Value Date   DIAGPAP  08/16/2020    - Negative for intraepithelial lesion or malignancy (NILM)   [name] Negative 08/16/2020    Current Medications, Allergies, Past Medical History, Past Surgical History, Family History and Social History were reviewed in [name] record.     Review of Systems: Patient denies any headaches, hearing loss, fatigue, blurred vision, shortness of breath, chest pain, abdominal pain, problems with bowel movements, urination, or intercourse(not active). No joint pain or mood swings.  She says she feels better since seeing Allison Goodman.And A1c 7.1 She had brown discharge in May after Megace but no red bleeding.    Physical Exam: BP 118/75 (BP Location: Right Arm, Patient Position: Sitting, Cuff Size: Normal)    Pulse (!) 104    Ht 5\' 3"  (1.6 m)    Wt 247 lb (112 kg)    LMP 02/21/2018    BMI 43.75 kg/m   General:  Well developed, well nourished, no acute distress Skin:  Warm and dry Neck:  Midline trachea, normal thyroid, good ROM, no lymphadenopathy Lungs; Clear to auscultation bilaterally Breast:  No dominant palpable mass, retraction, or nipple discharge Cardiovascular: Regular rate and rhythm Abdomen:  Soft, non tender, no hepatosplenomegaly Pelvic:  External genitalia is normal in appearance, no lesions.  The vagina is pale with loss of rugae.  Urethra has no lesions or masses. The cervix is smooth.  Uterus is felt to be normal size, shape, and contour.  No adnexal masses or tenderness noted.Bladder is non tender, no masses felt. Rectal: Good sphincter tone, no polyps, +hemorrhoids felt.   Hemoccult negative. Extremities/musculoskeletal:  No swelling or varicosities noted,has discoloration right LE, no clubbing or cyanosis Psych:  No mood changes, alert and cooperative,seems happy AA is 1 Fall risk is low Depression screen [facility] 2/9 09/01/2021 08/16/2020 07/27/2019  Decreased Interest 1 1 1   Down, Depressed, Hopeless 1 1 1   PHQ - 2 Score 2 2 2   Altered sleeping 1 1 1   Tired, decreased energy 1 1 1   Change in appetite 0 1 1  Feeling bad or failure about yourself  0 1 1  Trouble concentrating 0 1 0  Moving slowly or fidgety/restless 0 1 0  Suicidal thoughts 0 1 0  PHQ-9 Score 4 9 6   Difficult doing work/chores - - Somewhat difficult   She is on meds from PCP GAD 7 : Generalized Anxiety Score 09/01/2021 08/16/2020  Nervous, Anxious, on Edge 1 1  Control/stop worrying 1 2  Worry too much - different things 1 2  Trouble relaxing 1 2  Restless 1 0  Easily annoyed or irritable 1 2  Afraid - awful might happen 1 1  Total GAD 7 Score 7 10    Upstream - 09/01/21 0934       Pregnancy Intention Screening   Does the patient want to become pregnant in the next year? No    Does the patient's partner want to become pregnant in the next year? No    Would the patient like to discuss contraceptive options today? No  Contraception Wrap Up   Current Method Abstinence   pm   End Method Abstinence   PM   Contraception Counseling Provided No              Examination chaperoned by Allison Squibb LPN   Impression and Plan:  1. Encounter for well woman exam with routine gynecological exam Physical in 1 year  Pap in 2025 Labs with PCP Mammogram every 1-2 years  She had colonoscopy last year, and as per GI  2. Encounter for screening fecal occult blood testing   3. Thickened endometrium, on cyclical megestrol Will continue megace 40 mg 1 daily for 10 days every 3 months  Meds ordered this encounter  Medications   megestrol (MEGACE) 40 MG tablet    Sig: Take 1 tablet  daily for 10 days every 3 months    Dispense:  30 tablet    Refill:  4    Order Specific Question:   Supervising Provider    Answer:   Allison Goodman [[id number]]

## 2021-10-20 ENCOUNTER — Encounter (HOSPITAL_COMMUNITY): Payer: BC Managed Care – PPO

## 2021-11-21 ENCOUNTER — Ambulatory Visit (HOSPITAL_COMMUNITY)
Admission: RE | Admit: 2021-11-21 | Discharge: 2021-11-21 | Disposition: A | Discharge status: Home / Self Care | Payer: BC Managed Care – PPO | Source: Ambulatory Visit | Attending: Vascular Surgery | Admitting: Vascular Surgery

## 2021-11-21 ENCOUNTER — Encounter: Payer: Self-pay | Admitting: Vascular Surgery

## 2021-11-21 ENCOUNTER — Ambulatory Visit: Payer: BC Managed Care – PPO | Admitting: Vascular Surgery

## 2021-11-21 DIAGNOSIS — I87009 Postthrombotic syndrome without complications of unspecified extremity: Secondary | ICD-10-CM | POA: Diagnosis not present

## 2021-11-21 DIAGNOSIS — M7989 Other specified soft tissue disorders: Secondary | ICD-10-CM | POA: Diagnosis present

## 2021-11-21 NOTE — Progress Notes (Signed)
? ? ?Patient name: Allison Goodman MRN: [id number] DOB: 02-17-1961 Sex: female ? ?REASON FOR CONSULT: Evaluate bilateral lower extremity edema ? ?HPI: ?Allison Goodman is a 61 y.o. female, with history diabetes, hypertension and right leg DVT that presents for evaluation of bilateral lower extremity edema.  She states swelling in her legs has been ongoing for years.  She does feel her right leg is worse.  She has a history of right leg DVT diagnosed sometime around 2014.  Notes from 2014 suggest chronic DVT on the right mid superficial femoral vein and popliteal vein.  She has had no recurrent DVT since then.  She is on her feet for long periods of time working for [employer].  She does wear knee-high compression stockings that are medical grade 20 to 30 mmHg.  She has had ulcers on the right leg but these have since healed years ago.  Broke her right ankle years ago.  Saw Dr. Darrick Penna in 2014.   ? ?Past Medical History:  ?Diagnosis Date  ? Anxiety 08/14/2017  ? Cellulitis 12/22/2012  ? Rx doxy and refer to Dr. Hilda Lias may need hard ware removed  ? Depression 12/30/2015  ? Diabetes (HCC) 01/03/2016  ? Elevated blood sugar   ? Fatigue 12/28/2014  ? Hypertension   ? Irregular bleeding 12/30/2015  ? Menopause 12/24/2013  ? Obesity   ? Papanicolaou smear of cervix with positive high risk human papilloma virus (HPV) test 08/12/2019  ? +HPV 16 on pap to get colpo with Dr Despina Hidden 08/28/19 As per [facility]  ? PMB (postmenopausal bleeding) 12/28/2014  ? Restless leg   ? Seasonal allergies   ? Vitamin D deficiency 01/03/2016  ? ? ?Past Surgical History:  ?Procedure Laterality Date  ? ANKLE SURGERY Right   ? screws placed  ? ? ?Family History  ?Problem Relation Age of Onset  ? COPD Mother   ? Diabetes Mother   ? Hyperlipidemia Mother   ? Hypertension Mother   ? Parkinson's disease Father   ? Diabetes Brother   ? Hyperlipidemia Brother   ? Mitral valve prolapse Sister   ? Diabetes Brother   ? Hyperlipidemia Brother   ? Diabetes Sister    ? Hyperlipidemia Sister   ? Hypertension Sister   ? Heart disease Sister   ?     before age 60  ? Cancer Maternal Grandmother   ? Breast cancer Sister   ? Breast cancer Maternal Aunt   ? Breast cancer Paternal Aunt   ? ? ?SOCIAL HISTORY: ?Social History  ? ?Socioeconomic History  ? Marital status: Divorced  ?  Spouse name: Not on file  ? Number of children: 2  ? Years of education: Not on file  ? Highest education level: Not on file  ?Occupational History  ? Not on file  ?Tobacco Use  ? Smoking status: Never  ? Smokeless tobacco: Never  ?Vaping Use  ? Vaping Use: Never used  ?Substance and Sexual Activity  ? Alcohol use: No  ? Drug use: No  ? Sexual activity: Not Currently  ?  Birth control/protection: Post-menopausal, Abstinence  ?Other Topics Concern  ? Not on file  ?Social History Narrative  ? Not on file  ? ?Social Determinants of Health  ? ?Financial Resource Strain: Medium Risk  ? Difficulty of Paying Living Expenses: Somewhat hard  ?Food Insecurity: No Food Insecurity  ? Worried About Programme researcher, broadcasting/film/video in the Last Year: Never true  ? Ran Out of Food  in the Last Year: Never true  ?Transportation Needs: No Transportation Needs  ? Lack of Transportation (Medical): No  ? Lack of Transportation (Non-Medical): No  ?Physical Activity: Inactive  ? Days of Exercise per Week: 0 days  ? Minutes of Exercise per Session: 0 min  ?Stress: Stress Concern Present  ? Feeling of Stress : To some extent  ?Social Connections: Socially Isolated  ? Frequency of Communication with Friends and Family: Twice a week  ? Frequency of Social Gatherings with Friends and Family: Never  ? Attends Religious Services: Never  ? Active Member of Clubs or Organizations: No  ? Attends Archivist Meetings: Never  ? Marital Status: Divorced  ?Intimate Partner Violence: Not At Risk  ? Fear of Current or Ex-Partner: No  ? Emotionally Abused: No  ? Physically Abused: No  ? Sexually Abused: No  ? ? ?Allergies  ?Allergen Reactions  ?  Morphine And Related Nausea And Vomiting  ? ? ?Current Outpatient Medications  ?Medication Sig Dispense Refill  ? ALBUTEROL IN Inhale into the lungs as needed.    ? ARIPiprazole (ABILIFY) 2 MG tablet Take 2 mg by mouth daily.    ? aspirin 81 MG tablet Take 81 mg by mouth daily.    ? atorvastatin (LIPITOR) 20 MG tablet Take 20 mg by mouth daily.    ? Cholecalciferol (VITAMIN D3) 5000 units CAPS Take by mouth daily.    ? cyclobenzaprine (FLEXERIL) 10 MG tablet Take 10 mg by mouth 3 (three) times daily.    ? famotidine (PEPCID) 40 MG tablet Take 40 mg by mouth daily.    ? furosemide (LASIX) 40 MG tablet Take 40 mg by mouth daily.    ? gabapentin (NEURONTIN) 800 MG tablet Take 800 mg by mouth daily.    ? glipiZIDE (GLUCOTROL XL) 10 MG 24 hr tablet Take 10 mg by mouth daily.    ? HYDROcodone-acetaminophen (NORCO) 10-325 MG tablet Take 1.5 tablets by mouth 4 (four) times daily. PT said she averages about 21 tablets weekly// 03/09/20 patient states she takes as written    ? lisinopril (PRINIVIL,ZESTRIL) 10 MG tablet TAKE ONE (1) TABLET EACH DAY 90 tablet 4  ? loratadine (CLARITIN) 10 MG tablet Take 10 mg by mouth daily as needed for allergies.    ? LORazepam (ATIVAN) 0.5 MG tablet Take 0.5 mg by mouth 3 (three) times daily as needed.    ? LORazepam (ATIVAN) 1 MG tablet Take by mouth at bedtime.   4  ? megestrol (MEGACE) 40 MG tablet Take 1 tablet daily for 10 days every 3 months 30 tablet 4  ? meloxicam (MOBIC) 15 MG tablet TAKE ONE (1) TABLET EACH DAY 30 tablet 5  ? metFORMIN (GLUCOPHAGE) 1000 MG tablet Take 1,000 mg by mouth 2 (two) times daily.    ? naloxone (NARCAN) nasal spray 4 mg/0.1 mL SMARTSIG:Spray(s) Both Nares    ? potassium chloride (K-DUR) 10 MEQ tablet     ? rOPINIRole (REQUIP) 3 MG tablet Take 3 mg by mouth at bedtime.    ? venlafaxine (EFFEXOR) 100 MG tablet Take 100 mg by mouth 3 (three) times daily.    ? ?No current facility-administered medications for this visit.  ? ? ?REVIEW OF SYSTEMS:  ?[X]  denotes  positive finding, [ ]  denotes negative finding ?Cardiac  Comments:  ?Chest pain or chest pressure:    ?Shortness of breath upon exertion:    ?Short of breath when lying flat:    ?Irregular heart rhythm:    ?    ?  Vascular    ?Pain in calf, thigh, or hip brought on by ambulation:    ?Pain in feet at night that wakes you up from your sleep:     ?Blood clot in your veins:    ?Leg swelling:  x   ?    ?Pulmonary    ?Oxygen at home:    ?Productive cough:     ?Wheezing:     ?    ?Neurologic    ?Sudden weakness in arms or legs:     ?Sudden numbness in arms or legs:     ?Sudden onset of difficulty speaking or slurred speech:    ?Temporary loss of vision in one eye:     ?Problems with dizziness:     ?    ?Gastrointestinal    ?Blood in stool:     ?Vomited blood:     ?    ?Genitourinary    ?Burning when urinating:     ?Blood in urine:    ?    ?Psychiatric    ?Major depression:     ?    ?Hematologic    ?Bleeding problems:    ?Problems with blood clotting too easily:    ?    ?Skin    ?Rashes or ulcers:    ?    ?Constitutional    ?Fever or chills:    ? ? ?PHYSICAL EXAM: ?Vitals:  ? 11/21/21 1126  ?BP: 131/83  ?Pulse: 92  ?Resp: 20  ?Temp: 98.4 ?F (36.9 ?C)  ?SpO2: 97%  ?Weight: 246 lb 14.4 oz (112 kg)  ?Height: 5\' 3"  (1.6 m)  ? ? ?GENERAL: The patient is a well-nourished female, in no acute distress. The vital signs are documented above. ?CARDIAC: There is a regular rate and rhythm.  ?VASCULAR:  ?Difficult to appreciate femoral pulses ?Bilateral PT pulses palpable ?No open venous ulcerations ?PULMONARY: No respiratory distress. ?ABDOMEN: Soft and non-tender. ?MUSCULOSKELETAL: There are no major deformities or cyanosis. ?NEUROLOGIC: No focal weakness or paresthesias are detected. ?PSYCHIATRIC: The patient has a normal affect. ? ?DATA:  ? ?Lower Venous Reflux Study  ? ?Patient Name:  Allison Goodman  Date of Exam:   11/21/2021  ?Medical Rec #: [id number]          Accession #:    [id number]  ?Date of Birth: August 22, 1960          Patient  Gender: F  ?Patient Age:   58 years  ?Exam Location:  [facility]  ?Procedure:      VAS [place] LOWER EXTREMITY VENOUS REFLUX  ?Referring Phys: Monica Martinez  ? ? ?----------------------------

## 2022-07-20 ENCOUNTER — Other Ambulatory Visit (HOSPITAL_COMMUNITY): Payer: Self-pay | Admitting: Adult Health

## 2022-07-20 DIAGNOSIS — Z1231 Encounter for screening mammogram for malignant neoplasm of breast: Secondary | ICD-10-CM

## 2022-08-08 ENCOUNTER — Ambulatory Visit (HOSPITAL_COMMUNITY)
Admission: RE | Admit: 2022-08-08 | Discharge: 2022-08-08 | Disposition: A | Discharge status: Home / Self Care | Payer: BC Managed Care – PPO | Source: Ambulatory Visit | Attending: Adult Health | Admitting: Adult Health

## 2022-08-08 DIAGNOSIS — Z1231 Encounter for screening mammogram for malignant neoplasm of breast: Secondary | ICD-10-CM | POA: Diagnosis present

## 2022-09-06 ENCOUNTER — Ambulatory Visit: Payer: BC Managed Care – PPO | Admitting: Adult Health

## 2022-09-12 ENCOUNTER — Encounter: Payer: Self-pay | Admitting: Adult Health

## 2022-09-12 ENCOUNTER — Ambulatory Visit (INDEPENDENT_AMBULATORY_CARE_PROVIDER_SITE_OTHER): Payer: BC Managed Care – PPO | Admitting: Adult Health

## 2022-09-12 VITALS — BP 117/78 | HR 92 | Ht 63.0 in | Wt 230.0 lb

## 2022-09-12 DIAGNOSIS — I10 Essential (primary) hypertension: Secondary | ICD-10-CM | POA: Diagnosis not present

## 2022-09-12 DIAGNOSIS — Z1211 Encounter for screening for malignant neoplasm of colon: Secondary | ICD-10-CM

## 2022-09-12 DIAGNOSIS — L304 Erythema intertrigo: Secondary | ICD-10-CM | POA: Diagnosis not present

## 2022-09-12 DIAGNOSIS — Z01419 Encounter for gynecological examination (general) (routine) without abnormal findings: Secondary | ICD-10-CM | POA: Diagnosis not present

## 2022-09-12 LAB — HEMOCCULT GUIAC POC 1CARD (OFFICE): Fecal Occult Blood, POC: NEGATIVE

## 2022-09-12 MED ORDER — NYSTATIN 100000 UNIT/GM EX POWD: 1.0000 | Freq: Two times a day (BID) | CUTANEOUS | 1 refills | Status: DC

## 2022-09-12 NOTE — Progress Notes (Signed)
Patient ID: Allison Goodman, female   DOB: July 21, 1961, 62 y.o.   MRN: [id number] History of Present Illness: Allison Goodman is a 62 year old white female, divorced, PM in for well woman gyn exam. She is still working.  Last pap was negative HPV, NILM 08/16/20.  PCP is Simona Huh NP.   Current Medications, Allergies, Past Medical History, Past Surgical History, Family History and Social History were reviewed in [employer] record.     Review of Systems: Patient denies any headaches, hearing loss, fatigue, blurred vision, shortness of breath, chest pain, abdominal pain, problems with bowel movements, urination, or intercourse(not active). No joint pain or mood swings.  Has not seen any vaginal blood since May of 2023.   Physical Exam:BP 117/78 (BP Location: Left Arm, Patient Position: Sitting, Cuff Size: Normal)   Pulse 92   Ht 5\' 3"  (1.6 m)   Wt 230 lb (104.3 kg)   LMP 02/21/2018   BMI 40.74 kg/m   General:  Well developed, well nourished, no acute distress Skin:  Warm and dry Neck:  Midline trachea, normal thyroid, good ROM, no lymphadenopathy Lungs; Clear to auscultation bilaterally Breast:  No dominant palpable mass, retraction, or nipple discharge Cardiovascular: Regular rate and rhythm Abdomen:  Soft, non tender, no hepatosplenomegaly Pelvic:  External genitalia is red, on vulva and inner thighs where she looks chaffed, sweats at work, has used nystatin cream,but does not help. The vagina is pale. Urethra has no lesions or masses. The cervix is smooth.  Uterus is felt to be normal size, shape, and contour.  No adnexal masses or tenderness noted.Bladder is non tender, no masses felt. Rectal: Good sphincter tone, no polyps, or hemorrhoids felt.  Hemoccult negative. Extremities/musculoskeletal:  No swelling or varicosities noted, no clubbing or cyanosis Psych:  No mood changes, alert and cooperative,seems happy AA is 1 Fall risk is low    09/12/2022   10:38 AM  09/01/2021    9:32 AM 08/16/2020    3:33 PM  Depression screen PHQ 2/9  Decreased Interest 1 1 1   Down, Depressed, Hopeless 1 1 1   PHQ - 2 Score 2 2 2   Altered sleeping 1 1 1   Tired, decreased energy 1 1 1   Change in appetite 1 0 1  Feeling bad or failure about yourself  0 0 1  Trouble concentrating 0 0 1  Moving slowly or fidgety/restless 0 0 1  Suicidal thoughts 0 0 1  PHQ-9 Score 5 4 9        09/12/2022   10:38 AM 09/01/2021    9:32 AM 08/16/2020    3:44 PM  GAD 7 : Generalized Anxiety Score  Nervous, Anxious, on Edge 1 1 1   Control/stop worrying 1 1 2   Worry too much - different things 1 1 2   Trouble relaxing 1 1 2   Restless 0 1 0  Easily annoyed or irritable 0 1 2  Afraid - awful might happen 0 1 1  Total GAD 7 Score 4 7 10       Upstream - 09/12/22 1036       Pregnancy Intention Screening   Does the patient want to become pregnant in the next year? No    Does the patient's partner want to become pregnant in the next year? No    Would the patient like to discuss contraceptive options today? No      Contraception Wrap Up   Current Method Abstinence   PM   End Method Abstinence  PM   Contraception Counseling Provided No             Examination chaperoned by Levy Pupa LPN  Impression and Plan: 1. Encounter for well woman exam with routine gynecological exam Pap and physical in 1 year Labs with PCP Mammogram was negative 08/08/22. Colonoscopy per GI  2. Encounter for screening fecal occult blood testing Hemoccult was negative   3. Essential hypertension Continue lisinopril 10 mg daily, has refills she says   4. Chafing Try Aquaphor 3N1 cream Will rx nystatin powders Meds ordered this encounter  Medications   nystatin (MYCOSTATIN/NYSTOP) powder    Sig: Apply 1 Application topically 2 (two) times daily.    Dispense:  60 g    Refill:  1    Order Specific Question:   Supervising Provider    Answer:   Tania Ade H [[id number]]

## 2022-10-11 ENCOUNTER — Encounter: Payer: Self-pay | Admitting: Radiology

## 2022-11-14 ENCOUNTER — Encounter: Payer: Self-pay | Admitting: Orthopedic Surgery

## 2022-11-14 ENCOUNTER — Ambulatory Visit: Payer: BC Managed Care – PPO | Admitting: Orthopedic Surgery

## 2022-11-14 VITALS — BP 108/68 | HR 95 | Ht 63.0 in | Wt 231.0 lb

## 2022-11-14 DIAGNOSIS — S42254A Nondisplaced fracture of greater tuberosity of right humerus, initial encounter for closed fracture: Secondary | ICD-10-CM

## 2022-11-14 NOTE — Patient Instructions (Signed)
Continue to use the sling.  Medications as needed.  New sling provided.   Ok to shower.  Follow up in 10 days

## 2022-11-16 NOTE — Progress Notes (Signed)
New Patient Visit  Assessment: Allison Goodman is a 62 y.o. female with the following: Right shoulder, greater tuberosity fracture   Plan: Karma R Baldyga fell and sustained a minimally displaced right greater tuberosity fracture.  This should heal without surgery.  She is to remain in sling.  She was fitted for a better sling today.  I will see her back in clinic in approximately 10 days for repeat evaluation, including new x-rays.  Follow-up: Return in about 9 days (around 11/23/2022).  Subjective:  Chief Complaint  Patient presents with   Fracture    R shoulder DOI 11/11/22     History of Present Illness: Allison Goodman is a 62 y.o. female who presents for evaluation of right shoulder pain.  Just a few days ago, she states that she tripped and fell.  She landed on her right arm.  She had immediate pain in the shoulder, radiating distally to her elbow.  [facility] [place], and advised that she had a proximal humerus fracture.  She was sent to clinic for follow-up care.  No numbness or tingling.  No issues with the shoulder prior to falling.   Review of Systems: No fevers or chills No numbness or tingling No chest pain No shortness of breath No bowel or bladder dysfunction No GI distress No headaches   Medical History:  Past Medical History:  Diagnosis Date   Anxiety 08/14/2017   Cellulitis 12/22/2012   Rx doxy and refer to Dr. Hilda LiasKeeling may need hard ware removed   Depression 12/30/2015   Diabetes 01/03/2016   Elevated blood sugar    Fatigue 12/28/2014   Hypertension    Irregular bleeding 12/30/2015   Menopause 12/24/2013   Obesity    Papanicolaou smear of cervix with positive high risk human papilloma virus (HPV) test 08/12/2019   +HPV 16 on pap to get colpo with Dr Despina HiddenEure 08/28/19 As per [facility]   PMB (postmenopausal bleeding) 12/28/2014   Restless leg    Seasonal allergies    Vitamin D deficiency 01/03/2016    Past Surgical History:  Procedure  Laterality Date   ANKLE SURGERY Right    screws placed    Family History  Problem Relation Age of Onset   COPD Mother    Diabetes Mother    Hyperlipidemia Mother    Hypertension Mother    Parkinson's disease Father    Diabetes Brother    Hyperlipidemia Brother    Mitral valve prolapse Sister    Diabetes Brother    Hyperlipidemia Brother    Diabetes Sister    Hyperlipidemia Sister    Hypertension Sister    Heart disease Sister        before age 60   Cancer Maternal Grandmother    Breast cancer Sister    Breast cancer Maternal Aunt    Breast cancer Paternal Aunt    Social History   Tobacco Use   Smoking status: Never   Smokeless tobacco: Never  Vaping Use   Vaping Use: Never used  Substance Use Topics   Alcohol use: Yes    Comment: occ   Drug use: No    Allergies  Allergen Reactions   Morphine And Related Nausea And Vomiting    Current Meds  Medication Sig   oxyCODONE-acetaminophen (PERCOCET/ROXICET) 5-325 MG tablet Take by mouth.    Objective: BP 108/68   Pulse 95   Ht 5\' 3"  (1.6 m)   Wt 231 lb (104.8 kg)  LMP 02/21/2018   BMI 40.92 kg/m   Physical Exam:  General: Alert and oriented. and No acute distress. Gait: Normal gait.  Right shoulder without obvious deformity.  There is diffuse swelling and bruising about the shoulder.  Sensation is intact in the axillary nerve distribution.  Sling is fitting a little bit small.  Fingers are warm and well-perfused.  2+ radial pulse.  Sensation intact throughout the right hand.  IMAGING: I personally reviewed images previously obtained from the ED  X-rays from the emergency department demonstrates a mildly comminuted, minimally displaced fracture of the proximal humerus on the right side, and the area of the greater tuberosity.   New Medications:  No orders of the defined types were placed in this encounter.     Oliver Barre, MD  11/16/2022 11:27 PM

## 2022-11-19 ENCOUNTER — Telehealth: Payer: Self-pay | Admitting: Orthopedic Surgery

## 2022-11-19 NOTE — Telephone Encounter (Signed)
Dr. Dallas Schimke pt - patient called, spoke w/her, she is requesting a refill on Oxycodone that the ED prescribed.  She uses [facility].  Pt's # [phone]

## 2022-11-20 ENCOUNTER — Telehealth: Payer: Self-pay | Admitting: Orthopedic Surgery

## 2022-11-20 NOTE — Telephone Encounter (Signed)
Standard forms received. To [employer].

## 2022-11-21 NOTE — Telephone Encounter (Signed)
Spoke with patient regarding pain medication. Advised her that Dr.Cairns will not refill oxycodone because of the amount of hydrocodone she just received on 10/28/22 by another provider.She verbalized understanding.

## 2022-11-23 ENCOUNTER — Encounter: Payer: Self-pay | Admitting: Orthopedic Surgery

## 2022-11-23 ENCOUNTER — Other Ambulatory Visit (INDEPENDENT_AMBULATORY_CARE_PROVIDER_SITE_OTHER): Payer: BC Managed Care – PPO

## 2022-11-23 ENCOUNTER — Ambulatory Visit (INDEPENDENT_AMBULATORY_CARE_PROVIDER_SITE_OTHER): Payer: BC Managed Care – PPO | Admitting: Orthopedic Surgery

## 2022-11-23 DIAGNOSIS — S42254D Nondisplaced fracture of greater tuberosity of right humerus, subsequent encounter for fracture with routine healing: Secondary | ICD-10-CM

## 2022-11-23 NOTE — Patient Instructions (Signed)
Sling at all times.  Can remove for hygiene.

## 2022-11-23 NOTE — Progress Notes (Signed)
New Patient Visit  Assessment: Allison Goodman is a 62 y.o. female with the following: Right shoulder, greater tuberosity fracture   Plan: Allison Goodman has a minimally displaced, comminuted fracture of the right greater tuberosity.  There has been minimal interval displacement.  Will plan to continue with nonoperative management.  She is to remain in the sling at all times.  Okay to remove for hygiene.  She is on chronic narcotic medications, and will continue to take these medications as needed.  I will see her back in 2 weeks.  Follow-up: Return in about 2 weeks (around 12/07/2022).  Subjective:  Chief Complaint  Patient presents with   Post-op Follow-up    Right shoulder 11/11/22 has pain but improving a little / very tense guarded in sling holding shoulder stiff advised her to relax in the sling     History of Present Illness: Allison Goodman is a 62 y.o. female who returns for evaluation of right shoulder pain.  Approximately 2 weeks ago, she fell and sustained a proximal humerus fracture.  She has remained in the sling.  She has no numbness or tingling.  She is taking her regular pain medications.  She is having difficulty sleeping.    Review of Systems: No fevers or chills No numbness or tingling No chest pain No shortness of breath No bowel or bladder dysfunction No GI distress No headaches   Objective: LMP 02/21/2018   Physical Exam:  General: Alert and oriented. and No acute distress. Gait: Normal gait.  Right shoulder without obvious deformity.  There is diffuse swelling.  Bruising is improving.  Sensation is intact in the axillary nerve distribution.  Sling is fitting appropriately.  Fingers are warm and well-perfused.  2+ radial pulse.  Sensation intact throughout the right hand.  IMAGING: I personally ordered and reviewed the following images   X-rays of the right shoulder were obtained in clinic today.  There is a comminuted fracture of the greater  tuberosity.  Minimal interval displacement.  Glenohumeral joint is reduced.  No evidence of proximal humeral migration.  No bony lesions  Impression: Right comminuted greater tuberosity fracture in stable alignment   New Medications:  No orders of the defined types were placed in this encounter.     Oliver Barre, MD  11/23/2022 10:48 AM

## 2022-12-07 ENCOUNTER — Other Ambulatory Visit (INDEPENDENT_AMBULATORY_CARE_PROVIDER_SITE_OTHER): Payer: BC Managed Care – PPO

## 2022-12-07 ENCOUNTER — Ambulatory Visit (INDEPENDENT_AMBULATORY_CARE_PROVIDER_SITE_OTHER): Payer: BC Managed Care – PPO | Admitting: Orthopedic Surgery

## 2022-12-07 ENCOUNTER — Encounter: Payer: Self-pay | Admitting: Orthopedic Surgery

## 2022-12-07 DIAGNOSIS — S42254D Nondisplaced fracture of greater tuberosity of right humerus, subsequent encounter for fracture with routine healing: Secondary | ICD-10-CM

## 2022-12-07 NOTE — Progress Notes (Signed)
Return patient Visit  Assessment: Allison Goodman is a 62 y.o. female with the following: Right shoulder, greater tuberosity fracture   Plan: River R Steedman has a minimally displaced, comminuted fracture of the right greater tuberosity.  Radiographs are stable.  Her pain is improving.  She can take short breaks from the sling if she wants.  I recommended that she start working on range of motion of the elbow, wrist and hand.  She will also initiate pendulum activities.  I will see her back in 2 weeks.  At that time, anticipate that we will be able to get her out of the sling.  Follow-up: Return in about 2 weeks (around 12/21/2022).  Subjective:  Chief Complaint  Patient presents with   Routine Post Op    R shoulder DOI 11/11/22    History of Present Illness: Allison Goodman is a 62 y.o. female who returns for evaluation of right shoulder pain.  Approximately 4 weeks ago, she fell and sustained a proximal humerus fracture.  She continues to use a sling.  She denies numbness and tingling.  She states her pain is improving.  She is able to do more.  She is tolerating the sling well.   Review of Systems: No fevers or chills No numbness or tingling No chest pain No shortness of breath No bowel or bladder dysfunction No GI distress No headaches   Objective: LMP 02/21/2018   Physical Exam:  General: Alert and oriented. and No acute distress. Gait: Normal gait.  Right shoulder without deformity.  Bruising is almost completely healed.  Sensation is intact in the axillary nerve distribution.  Intact motion in the right hand.  2+ radial pulse.  Fingers are warm and well-perfused.   IMAGING: I personally ordered and reviewed the following images   X-rays right shoulder obtained in clinic today.  These have been compared to prior x-rays.  No acute injuries are noted.  Comminuted fracture of the greater tuberosity remains in stable position.  Glenohumeral joint is reduced.  No  interval displacement.  No bony lesions.  Impression: Stable right comminuted proximal humerus fracture.   New Medications:  No orders of the defined types were placed in this encounter.     Oliver Barre, MD  12/07/2022 9:55 AM

## 2022-12-07 NOTE — Patient Instructions (Signed)
Pendulum   Stand near a wall or a surface that you can hold onto for balance. Bend at the waist and let your left / right arm hang straight down. Use your other arm to support you. Keep your back straight and do not lock your knees. Relax your left / right arm and shoulder muscles, and move your hips and your trunk so your left / right arm swings freely. Your arm should swing because of the motion of your body, not because you are using your arm or shoulder muscles. Keep moving your hips and trunk so your arm swings in the following directions, as told by your health care provider: Side to side. Forward and backward. In clockwise and counterclockwise circles. Continue each motion for 20 seconds, or for as long as told by your health care provider. Slowly return to the starting position.  Repeat 10 times. Complete this exercise daily.   Okay to come out of the sling to work on elbow, wrist and hand range of motion.  Can take breaks from the sling, but you should be wearing it when you are upright.  Medications as needed.  Follow-up in 2-3 weeks.

## 2022-12-20 ENCOUNTER — Telehealth: Payer: Self-pay | Admitting: Orthopedic Surgery

## 2022-12-20 NOTE — Telephone Encounter (Signed)
Standard disability forms received. To [employer].

## 2022-12-21 ENCOUNTER — Other Ambulatory Visit (INDEPENDENT_AMBULATORY_CARE_PROVIDER_SITE_OTHER): Payer: BC Managed Care – PPO

## 2022-12-21 ENCOUNTER — Encounter: Payer: Self-pay | Admitting: Orthopedic Surgery

## 2022-12-21 ENCOUNTER — Ambulatory Visit (INDEPENDENT_AMBULATORY_CARE_PROVIDER_SITE_OTHER): Payer: BC Managed Care – PPO | Admitting: Orthopedic Surgery

## 2022-12-21 DIAGNOSIS — S42254D Nondisplaced fracture of greater tuberosity of right humerus, subsequent encounter for fracture with routine healing: Secondary | ICD-10-CM | POA: Diagnosis not present

## 2022-12-21 NOTE — Progress Notes (Signed)
Return patient Visit  Assessment: NONNIE LEBRUN is a 62 y.o. female with the following: Right shoulder, greater tuberosity fracture   Plan: Sandee R Bolinski has a minimally displaced, comminuted fracture of the right greater tuberosity.  Radiographs are stable.  Her pain has gotten much better.  She is initiated gentle range of motion of the shoulder, and she is now in a position to continue with passive range of motion above the level of the shoulder.  Exercises were demonstrated in clinic today.  As she gets her motion back, she can consider increasing lifting.  Currently, she is to lift nothing heavier than a coffee cup.  I would like to see her back in 1 month.  Formal physical therapy could be ordered if she is struggling.   Follow-up: Return in about 4 weeks (around 01/18/2023).  Subjective:  Chief Complaint  Patient presents with   Fracture    R shoulder DOI 11/11/22    History of Present Illness: ASTRAEA GALELLA is a 62 y.o. female who returns for evaluation of right shoulder pain.  Approximately 6 weeks ago, she fell and sustained a proximal humerus fracture.  Since I saw her last, she started coming out of her sling.  She started working on gentle range of motion and pendulum activities.  She states her pain is much better.  Review of Systems: No fevers or chills No numbness or tingling No chest pain No shortness of breath No bowel or bladder dysfunction No GI distress No headaches   Objective: LMP 02/21/2018   Physical Exam:  General: Alert and oriented. and No acute distress. Gait: Normal gait.  Right shoulder without deformity.  Minimal bruising.  She has sensation intact throughout the right upper extremity.  Intact motion throughout the right hand.  2+ DP pulse.   IMAGING: I personally ordered and reviewed the following images   X-rays of the right shoulder were obtained in clinic today.  These are compared to prior x-rays.  There has been no  interval displacement.  There is interval consolidation.  Comminution, including a fracture of the greater tuberosity remains in good position.  There is slight varus angulation at the proximal humerus.  No bony lesions.  Impression: Stable right comminuted proximal humerus fracture  New Medications:  No orders of the defined types were placed in this encounter.     Oliver Barre, MD  12/21/2022 10:10 AM

## 2022-12-21 NOTE — Patient Instructions (Signed)
Okay to come out of the sling.  Start working on range of motion of your right shoulder.  Exercises were demonstrated in clinic today.  Once you have achieved close to full range of motion, you can start to initiate some active motion.  Limit lifting to nothing more than a coffee cup currently.  Formal physical therapy is an option.

## 2022-12-24 ENCOUNTER — Telehealth: Payer: Self-pay | Admitting: Orthopedic Surgery

## 2022-12-24 NOTE — Telephone Encounter (Signed)
Standard forms received. To [employer]. 

## 2023-01-03 ENCOUNTER — Other Ambulatory Visit: Payer: Self-pay | Admitting: Adult Health

## 2023-01-18 ENCOUNTER — Other Ambulatory Visit (INDEPENDENT_AMBULATORY_CARE_PROVIDER_SITE_OTHER): Payer: BC Managed Care – PPO

## 2023-01-18 ENCOUNTER — Encounter: Payer: Self-pay | Admitting: Orthopedic Surgery

## 2023-01-18 ENCOUNTER — Ambulatory Visit: Payer: BC Managed Care – PPO | Admitting: Orthopedic Surgery

## 2023-01-18 DIAGNOSIS — S42254D Nondisplaced fracture of greater tuberosity of right humerus, subsequent encounter for fracture with routine healing: Secondary | ICD-10-CM

## 2023-01-18 NOTE — Patient Instructions (Addendum)
Please provide a work note, out until the next visit.   Rotator Cuff Tear/Tendinitis Rehab   Ask your health care provider which exercises are safe for you. Do exercises exactly as told by your health care provider and adjust them as directed. It is normal to feel mild stretching, pulling, tightness, or discomfort as you do these exercises. Stop right away if you feel sudden pain or your pain gets worse. Do not begin these exercises until told by your health care provider. Stretching and range-of-motion exercises  These exercises warm up your muscles and joints and improve the movement and flexibility of your shoulder. These exercises also help to relieve pain.  Shoulder pendulum In this exercise, you let the injured arm dangle toward the floor and then swing it like a clock pendulum. Stand near a table or counter that you can hold onto for balance. Bend forward at the waist and let your left / right arm hang straight down. Use your other arm to support you and help you stay balanced. Relax your left / right arm and shoulder muscles, and move your hips and your trunk so your left / right arm swings freely. Your arm should swing because of the motion of your body, not because you are using your arm or shoulder muscles. Keep moving your hips and trunk so your arm swings in the following directions, as told by your health care provider: Side to side. Forward and backward. In clockwise and counterclockwise circles. Slowly return to the starting position. Repeat 10 times, or for 10 seconds per direction. Complete this exercise 2-3 times a day.      Shoulder flexion, seated This exercise is sometimes called table slides. In this exercise, you raise your arm in front of your body until you feel a stretch in your injured shoulder. Sit in a stable chair so your left / right forearm can rest on a flat surface. Your elbow should rest at a height that keeps your upper arm next to your body. Keeping  your left / right shoulder relaxed, lean forward at the waist and let your hand slide forward (flexion). Stop when you feel a stretch in your shoulder, or when you reach the angle that is recommended by your health care provider. Hold for 5 seconds. Slowly return to the starting position. Repeat 10 times. Complete this exercise 1-2  times a day.       Shoulder flexion, standing In this exercise, you raise your arm in front of your body (flexion) until you feel a stretch in your injured shoulder. Stand and hold a broomstick, a cane, or a similar object. Place your hands a little more than shoulder-width apart on the object. Your left / right hand should be palm-up, and your other hand should be palm-down. Keep your elbow straight and your shoulder muscles relaxed. Push the stick up with your healthy arm to raise your left / right arm in front of your body, and then over your head until you feel a stretch in your shoulder. Avoid shrugging your shoulder while you raise your arm. Keep your shoulder blade tucked down toward the middle of your back. Keep your left / right shoulder muscles relaxed. Hold for 10 seconds. Slowly return to the starting position. Repeat 10 times. Complete this exercise 1-2 times a day.      Shoulder abduction, active-assisted You will need a stick, broom handle, or similar object to help you (assist) in doing this exercise. Lie on your back. This  is the supine position. Hold a broomstick, a cane, or a similar object. Place your hands a little more than shoulder-width apart on the object. Your left / right hand should be palm-up, and your other hand should be palm-down. Keeping your shoulder relaxed, push the stick to raise your left / right arm out to your side (abduction) and then over your head. Use your other hand to help move the stick. Stop when you feel a stretch in your shoulder, or when you reach the angle that is recommended by your health care provider. Avoid  shrugging your shoulder while you raise your arm. Keep your shoulder blade tucked down toward the middle of your back. Hold for 10 seconds. Slowly return to the starting position. Repeat 10 times. Complete this exercise 1-2 times a day.      Shoulder flexion, active-assisted Lie on your back. You may bend your knees for comfort. Hold a broomstick, a cane, or a similar object so that your hands are about shoulder-width apart. Your palms should face toward your feet. Raise your left / right arm over your head, then behind your head toward the floor (flexion). Use your other hand to help you do this (active-assisted). Stop when you feel a gentle stretch in your shoulder, or when you reach the angle that is recommended by your health care provider. Hold for 10 seconds. Use the stick and your other arm to help you return your left / right arm to the starting position. Repeat 10 times. Complete this exercise 1-2 times a day.      External rotation Sit in a stable chair without armrests, or stand up. Tuck a soft object, such as a folded towel or a small ball, under your left / right upper arm. Hold a broomstick, a cane, or a similar object with your palms face-down, toward the floor. Bend your elbows to a 90-degree angle (right angle), and keep your hands about shoulder-width apart. Straighten your healthy arm and push the stick across your body, toward your left / right side. Keep your left / right arm bent. This will rotate your left / right forearm away from your body (external rotation). Hold for 10 seconds. Slowly return to the starting position. Repeat 10 times. Complete this exercise 1-2 times a day.        Strengthening exercises These exercises build strength and endurance in your shoulder. Endurance is the ability to use your muscles for a long time, even after they get tired. Do not start doing these exercises until your health care provider approves. Shoulder flexion,  isometric Stand or sit in a doorway, facing the door frame. Keep your left / right arm straight and make a gentle fist with your hand. Place your fist against the door frame. Only your fist should be touching the frame. Keep your upper arm at your side. Gently press your fist against the door frame, as if you are trying to raise your arm above your head (isometric shoulder flexion). Avoid shrugging your shoulder while you press your hand into the door frame. Keep your shoulder blade tucked down toward the middle of your back. Hold for 10 seconds. Slowly release the tension, and relax your muscles completely before you repeat the exercise. Repeat 10 times. Complete this exercise 3 times per week.      Shoulder abduction, isometric Stand or sit in a doorway. Your left / right arm should be closest to the door frame. Keep your left / right arm straight,  and place the back of your hand against the door frame. Only your hand should be touching the frame. Keep the rest of your arm close to your side. Gently press the back of your hand against the door frame, as if you are trying to raise your arm out to the side (isometric shoulder abduction). Avoid shrugging your shoulder while you press your hand into the door frame. Keep your shoulder blade tucked down toward the middle of your back. Hold for 10 seconds. Slowly release the tension, and relax your muscles completely before you repeat the exercise. Repeat 10 times. Complete this exercise 3 times per week.      Internal rotation, isometric This is an exercise in which you press your palm against a door frame without moving your shoulder joint (isometric). Stand or sit in a doorway, facing the door frame. Bend your left / right elbow, and place the palm of your hand against the door frame. Only your palm should be touching the frame. Keep your upper arm at your side. Gently press your hand against the door frame, as if you are trying to push your arm  toward your abdomen (internal rotation). Gradually increase the pressure until you are pressing as hard as you can. Stop increasing the pressure if you feel shoulder pain. Avoid shrugging your shoulder while you press your hand into the door frame. Keep your shoulder blade tucked down toward the middle of your back. Hold for 10 seconds. Slowly release the tension, and relax your muscles completely before you repeat the exercise. Repeat 10 times. Complete this exercise 3 times per week.      External rotation, isometric This is an exercise in which you press the back of your wrist against a door frame without moving your shoulder joint (isometric). Stand or sit in a doorway, facing the door frame. Bend your left / right elbow and place the back of your wrist against the door frame. Only the back of your wrist should be touching the frame. Keep your upper arm at your side. Gently press your wrist against the door frame, as if you are trying to push your arm away from your abdomen (external rotation). Gradually increase the pressure until you are pressing as hard as you can. Stop increasing the pressure if you feel pain. Avoid shrugging your shoulder while you press your wrist into the door frame. Keep your shoulder blade tucked down toward the middle of your back. Hold for 10 seconds. Slowly release the tension, and relax your muscles completely before you repeat the exercise. Repeat 10 times. Complete this exercise 3 times per week.       Scapular retraction Sit in a stable chair without armrests, or stand up. Secure an exercise band to a stable object in front of you so the band is at shoulder height. Hold one end of the exercise band in each hand. Your palms should face down. Squeeze your shoulder blades together (retraction) and move your elbows slightly behind you. Do not shrug your shoulders upward while you do this. Hold for 10 seconds. Slowly return to the starting position. Repeat 10  times. Complete this exercise 3 times per week.      Shoulder extension Sit in a stable chair without armrests, or stand up. Secure an exercise band to a stable object in front of you so the band is above shoulder height. Hold one end of the exercise band in each hand. Straighten your elbows and lift your hands up  to shoulder height. Squeeze your shoulder blades together and pull your hands down to the sides of your thighs (extension). Stop when your hands are straight down by your sides. Do not let your hands go behind your body. Hold for 10 seconds. Slowly return to the starting position. Repeat 10 times. Complete this exercise 3 times per week.       Scapular protraction, supine Lie on your back on a firm surface (supine position). Hold a 5 lbs (or soup can) weight in your left / right hand. Raise your left / right arm straight into the air so your hand is directly above your shoulder joint. Push the weight into the air so your shoulder (scapula) lifts off the surface that you are lying on. The scapula will push up or forward (protraction). Do not move your head, neck, or back. Hold for 10 seconds. Slowly return to the starting position. Let your muscles relax completely before you repeat this exercise.  Repeat 10 times. Complete this exercise 3 times per week.

## 2023-01-18 NOTE — Progress Notes (Signed)
Return patient Visit  Assessment: Allison Goodman is a 62 y.o. female with the following: Right shoulder, greater tuberosity fracture   Plan: Analia R Beshears sustained a minimally displaced right greater tuberosity fracture, little over 2 months ago.  Radiographs today look very good.  There has been consolidation of the fracture.  On exam, she has excellent active range of motion, including 160 degrees of forward flexion.  I am very impressed with her motion at this time.  She remains frustrated by her overall lack of progression, and she still has some pain in her shoulder.  I did provide reassurance.  She will continue to get better.  She will start to incorporate strengthening activities.  I provided her with some additional rotator cuff rehab exercises.  She states understanding.  Follow-up in 1 month.  Out of work until that visit.  Follow-up: Return in about 4 weeks (around 02/15/2023).  Subjective:  Chief Complaint  Patient presents with   Fracture    R shoulder DOI 11/11/22    History of Present Illness: Allison Goodman is a 62 y.o. female who returns for evaluation of right shoulder pain.  Approximately 2 months ago, she fell and sustained a proximal humerus fracture.  She is no longer using the sling.  She has started working on range of motion activities.  She notes some pain in the anterior and posterior aspect of the shoulder.  She has progressed her activities, and is doing quite well.  She remains at work.   Review of Systems: No fevers or chills No numbness or tingling No chest pain No shortness of breath No bowel or bladder dysfunction No GI distress No headaches   Objective: LMP 02/21/2018   Physical Exam:  General: Alert and oriented. and No acute distress. Gait: Normal gait.  Right shoulder without deformity.  No bruising.  She is able to actively forward flex the shoulder 160 degrees.  90 degrees of abduction.  Strength testing was deferred, but she  has good active range of motion.  Fingers warm and well-perfused.  Tenderness to palpation in the anterior and posterior aspect of the shoulder.   IMAGING: I personally ordered and reviewed the following images  X-rays right shoulder obtained in clinic today.  These are compared to prior x-rays.  There has been interval consolidation of the greater tuberosity fracture fragment.  Glenohumeral joint remains reduced.  Well-maintained glenohumeral joint space.  No evidence of proximal humeral migration.  No bony lesions.  No dislocation.  Patient: Healed right greater tuberosity fracture  New Medications:  No orders of the defined types were placed in this encounter.     Oliver Barre, MD  01/18/2023 9:26 AM

## 2023-02-15 ENCOUNTER — Other Ambulatory Visit (INDEPENDENT_AMBULATORY_CARE_PROVIDER_SITE_OTHER): Payer: BC Managed Care – PPO

## 2023-02-15 ENCOUNTER — Encounter: Payer: Self-pay | Admitting: Orthopedic Surgery

## 2023-02-15 ENCOUNTER — Ambulatory Visit: Payer: BC Managed Care – PPO | Admitting: Orthopedic Surgery

## 2023-02-15 VITALS — BP 126/82 | HR 103 | Ht 63.0 in | Wt 209.0 lb

## 2023-02-15 DIAGNOSIS — S42254D Nondisplaced fracture of greater tuberosity of right humerus, subsequent encounter for fracture with routine healing: Secondary | ICD-10-CM | POA: Diagnosis not present

## 2023-02-15 NOTE — Progress Notes (Signed)
Return patient Visit  Assessment: Allison Goodman is a 62 y.o. female with the following: Right shoulder, greater tuberosity fracture   Plan: Allison Goodman sustained a minimally displaced right greater tuberosity fracture, approximately 3 months ago.  There has been slight progression on the radiographs, but she has excellent clinical function.  She has good strength and good range of motion.  She feels ready to go back to work.  I think it is reasonable.  Provided documentation.  She will return to clinic as needed.  Follow-up: Return if symptoms worsen or fail to improve.  Subjective:  Chief Complaint  Patient presents with   Fracture    R shoulder DOI 11/11/22    History of Present Illness: Allison Goodman is a 62 y.o. female who returns for evaluation of right shoulder pain.  Approximately 3 months ago, she fell and sustained a proximal humerus fracture.  She has been increasing her function.  She has good range of motion.  Her pain is much better.  She does continue to have some aching sensations in the anterior aspect of the shoulder.  No numbness or tingling.  She feels ready to go back to work.  Review of Systems: No fevers or chills No numbness or tingling No chest pain No shortness of breath No bowel or bladder dysfunction No GI distress No headaches   Objective: BP 126/82   Pulse (!) 103   Ht 5\' 3"  (1.6 m)   Wt 209 lb (94.8 kg)   LMP 02/21/2018   BMI 37.02 kg/m   Physical Exam:  General: Alert and oriented. and No acute distress. Gait: Normal gait.  Right shoulder without deformity.  No bruising.  She is able to actively forward flex the shoulder 160 degrees.  90 degrees of abduction.  Mild tenderness to palpation over the anterior shoulder.  5/5 deltoid, supraspinatus and infraspinatus strength testing.  Fingers are warm and well-perfused.  IMAGING: I personally ordered and reviewed the following images  Trays of the right shoulder were obtained  in clinic today.  These compared to prior x-rays.  There has been some impaction of the humeral head compared to prior x-rays.  No lateral x-rays are available in clinic today.  Glenohumeral joint remains reduced.  Consolidation through the fracture site.  Impression: Healed right proximal humerus fracture  New Medications:  No orders of the defined types were placed in this encounter.     Oliver Barre, MD  02/15/2023 9:14 AM

## 2023-02-15 NOTE — Patient Instructions (Signed)
Note for work, okay to return on 02/18/2023

## 2023-07-08 ENCOUNTER — Other Ambulatory Visit (HOSPITAL_COMMUNITY): Payer: Self-pay | Admitting: Adult Health

## 2023-07-08 DIAGNOSIS — Z1231 Encounter for screening mammogram for malignant neoplasm of breast: Secondary | ICD-10-CM

## 2023-08-12 ENCOUNTER — Ambulatory Visit (HOSPITAL_COMMUNITY): Payer: BC Managed Care – PPO

## 2023-09-23 ENCOUNTER — Ambulatory Visit (HOSPITAL_COMMUNITY): Payer: BC Managed Care – PPO

## 2023-10-01 ENCOUNTER — Encounter: Payer: Self-pay | Admitting: Obstetrics & Gynecology

## 2023-10-01 ENCOUNTER — Ambulatory Visit: Payer: BC Managed Care – PPO | Admitting: Obstetrics & Gynecology

## 2023-10-01 ENCOUNTER — Other Ambulatory Visit (HOSPITAL_COMMUNITY)
Admission: RE | Admit: 2023-10-01 | Discharge: 2023-10-01 | Disposition: A | Discharge status: Home / Self Care | Payer: BC Managed Care – PPO | Source: Ambulatory Visit | Attending: Obstetrics & Gynecology | Admitting: Obstetrics & Gynecology

## 2023-10-01 VITALS — BP 126/75 | HR 97 | Ht 63.0 in | Wt 179.0 lb

## 2023-10-01 DIAGNOSIS — Z124 Encounter for screening for malignant neoplasm of cervix: Secondary | ICD-10-CM | POA: Insufficient documentation

## 2023-10-01 DIAGNOSIS — N950 Postmenopausal bleeding: Secondary | ICD-10-CM

## 2023-10-01 NOTE — Progress Notes (Signed)
Endometrial Biopsy Procedure Note  Pre-operative Diagnosis: Complicated several years of post menopausal bleeding, was on culcical provera q3 months but then patient decided on her own to stop it when she did not bleed for 2 cycles, no bleeding since May 2023, but has not been cycling with provera either  Post-operative Diagnosis: same  Indications: postmenopausal bleeding  Procedure Details   Urine pregnancy test was not done.  The risks (including infection, bleeding, pain, and uterine perforation) and benefits of the procedure were explained to the patient and Written informed consent was obtained.  Antibiotic prophylaxis against endocarditis was not indicated.   The patient was placed in the dorsal lithotomy position.  Bimanual exam showed the uterus to be in the neutral position.  A Graves' speculum inserted in the vagina, and the cervix prepped with povidone iodine.  Endocervical curettage with a Kevorkian curette was not performed.   A sharp tenaculum was applied to the anterior lip of the cervix for stabilization.  A sterile uterine sound was used to sound the uterus to a depth of 6.5 cm.  A Pipelle endometrial aspirator was used to sample the endometrium.  Sample was sent for pathologic examination.  Condition: Stable  Complications: None  Plan:  The patient was advised to call for any fever or for prolonged or severe pain or bleeding. She was advised to use OTC analgesics as needed for mild to moderate pain. She was advised to avoid vaginal intercourse for 48 hours or until the bleeding has completely stopped.  Attending Physician Documentation: I was present for or performed the following: endometrial biopsy

## 2023-10-01 NOTE — Addendum Note (Signed)
Addended by: Annamarie Dawley on: 10/01/2023 11:28 AM   Modules accepted: Orders

## 2023-10-01 NOTE — Addendum Note (Signed)
Addended by: Annamarie Dawley on: 10/01/2023 10:50 AM   Modules accepted: Orders

## 2023-10-02 ENCOUNTER — Ambulatory Visit: Payer: BC Managed Care – PPO | Admitting: Adult Health

## 2023-10-02 ENCOUNTER — Ambulatory Visit (HOSPITAL_COMMUNITY): Payer: BC Managed Care – PPO

## 2023-10-02 LAB — SURGICAL PATHOLOGY

## 2023-10-03 LAB — CYTOLOGY - PAP
Comment: NEGATIVE
Diagnosis: UNDETERMINED — AB
High risk HPV: NEGATIVE

## 2023-10-09 ENCOUNTER — Ambulatory Visit (HOSPITAL_COMMUNITY): Payer: BC Managed Care – PPO

## 2023-10-18 ENCOUNTER — Ambulatory Visit (HOSPITAL_COMMUNITY)
Admission: RE | Admit: 2023-10-18 | Discharge: 2023-10-18 | Disposition: A | Discharge status: Home / Self Care | Payer: BC Managed Care – PPO | Source: Ambulatory Visit | Attending: Adult Health | Admitting: Adult Health

## 2023-10-18 DIAGNOSIS — Z1231 Encounter for screening mammogram for malignant neoplasm of breast: Secondary | ICD-10-CM | POA: Insufficient documentation

## 2024-04-27 ENCOUNTER — Inpatient Hospital Stay

## 2024-04-27 ENCOUNTER — Other Ambulatory Visit: Payer: Self-pay | Admitting: *Deleted

## 2024-04-27 ENCOUNTER — Inpatient Hospital Stay: Attending: Hematology and Oncology | Admitting: Hematology and Oncology

## 2024-04-27 VITALS — BP 115/63 | HR 74 | Temp 98.1°F | Resp 16 | Ht 63.0 in | Wt 168.9 lb

## 2024-04-27 DIAGNOSIS — Z803 Family history of malignant neoplasm of breast: Secondary | ICD-10-CM | POA: Diagnosis not present

## 2024-04-27 DIAGNOSIS — I82511 Chronic embolism and thrombosis of right femoral vein: Secondary | ICD-10-CM | POA: Insufficient documentation

## 2024-04-27 DIAGNOSIS — Z7901 Long term (current) use of anticoagulants: Secondary | ICD-10-CM | POA: Diagnosis not present

## 2024-04-27 DIAGNOSIS — I825Z1 Chronic embolism and thrombosis of unspecified deep veins of right distal lower extremity: Secondary | ICD-10-CM | POA: Insufficient documentation

## 2024-04-27 DIAGNOSIS — D6851 Activated protein C resistance: Secondary | ICD-10-CM | POA: Insufficient documentation

## 2024-04-27 DIAGNOSIS — I82501 Chronic embolism and thrombosis of unspecified deep veins of right lower extremity: Secondary | ICD-10-CM | POA: Insufficient documentation

## 2024-04-27 NOTE — Assessment & Plan Note (Signed)
 03/09/2024: Right leg: Evidence of acute nonocclusive, DVT with reflux in the femoral vein distal thigh and popliteal vein.  (Performed by [facility])

## 2024-04-27 NOTE — Progress Notes (Signed)
 [facility] CONSULT NOTE  Patient Care Team: Leron Millman, NP as PCP - General (Nurse Practitioner)  CHIEF COMPLAINTS/PURPOSE OF CONSULTATION:  Recurrent blood clots  HISTORY OF PRESENTING ILLNESS:   History of Present Illness Allison Goodman is a 62 year old female who presents with a recent blood clot in her leg. She was referred by her vein specialist for evaluation of her recent blood clot.  She experienced her first blood clot over ten years ago following ankle surgery in 2008. The recent clot was identified in July 2025 after a three and a half hour car ride to [facility]. She has poor circulation in the affected leg since the surgery.  She is currently on Eliquis since July 2025 and tolerates it well. There is no family history of blood clots. She denies smoking and hormone use. She works in Designer, fashion/clothing, spending most of her day on her feet on concrete floors, and has difficulty squatting due to her previous ankle surgery.    I reviewed her records extensively and collaborated the history with the patient.   MEDICAL HISTORY:  Past Medical History:  Diagnosis Date   Anxiety 08/14/2017   Cellulitis 12/22/2012   Rx doxy and refer to Dr. Brenna may need hard ware removed   Depression 12/30/2015   Diabetes (HCC) 01/03/2016   Elevated blood sugar    Fatigue 12/28/2014   Hypertension    Irregular bleeding 12/30/2015   Menopause 12/24/2013   Obesity    Papanicolaou smear of cervix with positive high risk human papilloma virus (HPV) test 08/12/2019   +HPV 16 on pap to get colpo with Dr Jayne 08/28/19 As per [facility]   PMB (postmenopausal bleeding) 12/28/2014   Restless leg    Seasonal allergies    Vitamin D  deficiency 01/03/2016    SURGICAL HISTORY: Past Surgical History:  Procedure Laterality Date   ANKLE SURGERY Right    screws placed    SOCIAL HISTORY: Social History   Socioeconomic History   Marital status: Divorced    Spouse name: Not on file   Number  of children: 2   Years of education: Not on file   Highest education level: Not on file  Occupational History   Not on file  Tobacco Use   Smoking status: Never   Smokeless tobacco: Never  Vaping Use   Vaping status: Never Used  Substance and Sexual Activity   Alcohol use: Yes    Comment: occ   Drug use: No   Sexual activity: Not Currently    Birth control/protection: Post-menopausal, Abstinence  Other Topics Concern   Not on file  Social History Narrative   Not on file   Social Drivers of Health   Financial Resource Strain: Low Risk  (09/12/2022)   Overall Financial Resource Strain (CARDIA)    Difficulty of Paying Living Expenses: Not very hard  Food Insecurity: No Food Insecurity (04/27/2024)   Hunger Vital Sign    Worried About Running Out of Food in the Last Year: Never true    Ran Out of Food in the Last Year: Never true  Transportation Needs: No Transportation Needs (04/27/2024)   PRAPARE - Administrator, Civil Service (Medical): No    Lack of Transportation (Non-Medical): No  Physical Activity: Inactive (09/12/2022)   Exercise Vital Sign    Days of Exercise per Week: 0 days    Minutes of Exercise per Session: 0 min  Stress: Stress Concern Present (09/12/2022)   [employer]  of Occupational Health - Occupational Stress Questionnaire    Feeling of Stress : To some extent  Social Connections: Socially Isolated (09/12/2022)   Social Connection and Isolation Panel    Frequency of Communication with Friends and Family: Twice a week    Frequency of Social Gatherings with Friends and Family: Twice a week    Attends Religious Services: Never    Database administrator or Organizations: No    Attends Banker Meetings: Never    Marital Status: Divorced  Catering manager Violence: Not At Risk (04/27/2024)   Humiliation, Afraid, Rape, and Kick questionnaire    Fear of Current or Ex-Partner: No    Emotionally Abused: No    Physically Abused: No     Sexually Abused: No    FAMILY HISTORY: Family History  Problem Relation Age of Onset   COPD Mother    Diabetes Mother    Hyperlipidemia Mother    Hypertension Mother    Parkinson's disease Father    Diabetes Brother    Hyperlipidemia Brother    Mitral valve prolapse Sister    Diabetes Brother    Hyperlipidemia Brother    Diabetes Sister    Hyperlipidemia Sister    Hypertension Sister    Heart disease Sister        before age 70   Cancer Maternal Grandmother    Breast cancer Sister    Breast cancer Maternal Aunt    Breast cancer Paternal Aunt     ALLERGIES:  is allergic to morphine and codeine.  MEDICATIONS:  Current Outpatient Medications  Medication Sig Dispense Refill   ARIPiprazole (ABILIFY) 2 MG tablet Take 2 mg by mouth daily.     aspirin 81 MG tablet Take 81 mg by mouth daily.     atorvastatin (LIPITOR) 20 MG tablet Take 20 mg by mouth daily.     Cholecalciferol  (VITAMIN D3) 5000 units CAPS Take by mouth daily.     famotidine (PEPCID) 40 MG tablet Take 40 mg by mouth daily.     furosemide (LASIX) 40 MG tablet Take 40 mg by mouth daily.     gabapentin (NEURONTIN) 800 MG tablet Take 800 mg by mouth daily.     HYDROcodone -acetaminophen  (NORCO) 10-325 MG tablet Take 1.5 tablets by mouth 4 (four) times daily. PT said she averages about 21 tablets weekly// 03/09/20 patient states she takes as written     lisinopril  (PRINIVIL ,ZESTRIL ) 10 MG tablet TAKE ONE (1) TABLET EACH DAY 90 tablet 4   LORazepam (ATIVAN) 0.5 MG tablet Take 0.5 mg by mouth 3 (three) times daily as needed.     MOUNJARO 15 MG/0.5ML Pen      naloxone (NARCAN) nasal spray 4 mg/0.1 mL SMARTSIG:Spray(s) Both Nares     potassium chloride  (K-DUR) 10 MEQ tablet      rOPINIRole (REQUIP) 3 MG tablet Take 3 mg by mouth at bedtime.     venlafaxine (EFFEXOR) 100 MG tablet Take 100 mg by mouth 3 (three) times daily.     No current facility-administered medications for this visit.    REVIEW OF SYSTEMS:    Constitutional: Denies fevers, chills or abnormal night sweats All other systems were reviewed with the patient and are negative.  PHYSICAL EXAMINATION: ECOG PERFORMANCE STATUS: 1 - Symptomatic but completely ambulatory  Vitals:   04/27/24 1300  BP: 115/63  Pulse: 74  Resp: 16  Temp: 98.1 F (36.7 C)  SpO2: 100%   Filed Weights   04/27/24 1300  Weight:  168 lb 14.4 oz (76.6 kg)    GENERAL:alert, no distress and comfortable  LABORATORY DATA:  I have reviewed the data as listed Lab Results  Component Value Date   WBC 7.0 07/25/2017   HGB 12.6 12/08/2017   HCT 37.0 12/08/2017   MCV 89 07/25/2017   PLT 329 07/25/2017   Lab Results  Component Value Date   NA 141 12/08/2017   K 4.2 12/08/2017   CL 101 12/08/2017   CO2 27 07/25/2017    RADIOGRAPHIC STUDIES: I have personally reviewed the radiological reports and agreed with the findings in the report.  ASSESSMENT AND PLAN:  Leg DVT (deep venous thromboembolism), chronic, right (HCC) 03/09/2024: Right leg: Evidence of acute nonocclusive, DVT with reflux in the femoral vein distal thigh and popliteal vein.  [facility]: Right ankle fracture resulted in blood clot in the right leg but it appears to be superficial and was treated with aspirin.  I discussed with the patient risk factors for blood clots.  Inherited risk factors include: 1. Factor V Leiden mutation 2. Prothrombin gene [id number] 3. Protein S deficiency  4. Protein C deficiency  5. Antithrombin deficiency  Acquired risk factors include: 1. Antiphospholipid antibody syndrome 2. Tobacco use 3. Medications including oral contraceptives: Patient does not take any hormone therapies 4.  Recent trip to [facility] in a car   Workup recommended: Bloodwork to evaluate for the 5 inherited factors mentioned above along with antiphospholipid antibodies. Telephone visit in 2 weeks to discuss the results of the tests  Duration  of therapy would depend on the cause of blood clots. If there is no identifiable risk factor then I would treat her for 6 months and check an ultrasound before stopping anticoagulation.  All questions were answered. The patient knows to call the clinic with any problems, questions or concerns.    Viinay K Maylee Bare, MD 04/27/24

## 2024-04-28 LAB — LUPUS ANTICOAGULANT PANEL
DRVVT: 45.2 s (ref 0.0–47.0)
PTT Lupus Anticoagulant: 29.1 s (ref 0.0–43.5)

## 2024-04-28 LAB — ANTITHROMBIN III ANTIGEN: AT III AG PPP IMM-ACNC: 94 % (ref 72–124)

## 2024-04-28 LAB — PROTEIN S, TOTAL: Protein S Ag, Total: 94 % (ref 60–150)

## 2024-04-29 LAB — BETA-2-GLYCOPROTEIN I ABS, IGG/M/A
Beta-2 Glyco I IgG: <9 GPI IgG units (ref 0–20)
Beta-2-Glycoprotein I IgA: <9 GPI IgA units (ref 0–25)
Beta-2-Glycoprotein I IgM: <9 GPI IgM units (ref 0–32)

## 2024-04-30 LAB — CARDIOLIPIN ANTIBODIES, IGG, IGM, IGA
Anticardiolipin IgA: <9 U/mL (ref 0–11)
Anticardiolipin IgG: <9 GPL U/mL (ref 0–14)
Anticardiolipin IgM: <9 [MPL'U]/mL (ref 0–12)

## 2024-04-30 LAB — FACTOR 5 LEIDEN

## 2024-05-01 LAB — PROTHROMBIN GENE MUTATION

## 2024-05-01 LAB — PROTEIN C, TOTAL: Protein C, Total: 110 % (ref 60–150)

## 2024-05-11 ENCOUNTER — Inpatient Hospital Stay: Admitting: Hematology and Oncology

## 2024-05-11 DIAGNOSIS — Z7901 Long term (current) use of anticoagulants: Secondary | ICD-10-CM

## 2024-05-11 DIAGNOSIS — I82511 Chronic embolism and thrombosis of right femoral vein: Secondary | ICD-10-CM | POA: Diagnosis not present

## 2024-05-11 DIAGNOSIS — I82531 Chronic embolism and thrombosis of right popliteal vein: Secondary | ICD-10-CM | POA: Diagnosis not present

## 2024-05-11 DIAGNOSIS — I825Z1 Chronic embolism and thrombosis of unspecified deep veins of right distal lower extremity: Secondary | ICD-10-CM

## 2024-05-11 DIAGNOSIS — I82501 Chronic embolism and thrombosis of unspecified deep veins of right lower extremity: Secondary | ICD-10-CM

## 2024-05-11 NOTE — Progress Notes (Signed)
 HEMATOLOGY-ONCOLOGY TELEPHONE VISIT PROGRESS NOTE  I connected with our patient on 05/11/24 at  3:30 PM EDT by telephone and verified that I am speaking with the correct person using two identifiers.  I discussed the limitations, risks, security and privacy concerns of performing an evaluation and management service by telephone and the availability of in person appointments.  I also discussed with the patient that there may be a patient responsible charge related to this service. The patient expressed understanding and agreed to proceed.   History of Present Illness: Telephone follow-up to discuss hypercoagulability workup  History of Present Illness Ms. Artus is a 63 year old with above-mentioned history of blood clot who underwent hypercoagulable workup and is connected with me by telephone to discuss results.  Overall she appears to be tolerating the anticoagulation fairly well.  REVIEW OF SYSTEMS:   Constitutional: Denies fevers, chills or abnormal weight loss All other systems were reviewed with the patient and are negative. Observations/Objective:     Assessment Plan:  Leg DVT (deep venous thromboembolism), chronic, right (HCC) 03/09/2024: Right leg: Evidence of acute nonocclusive, DVT with reflux in the femoral vein distal thigh and popliteal vein.  [facility] 2008: Right ankle fracture resulted in blood clot in the right leg but it appears to be superficial and was treated with aspirin.  Workup for blood clots: Negative for lupus anticoagulant, Antithrombin III : 94%, protein S: 94%, protein C: 110%, factor V Leiden: Negative, prothrombin gene mutation: Negative  Treatment plan: 6 months of anticoagulation and checking for ultrasound and a D-dimer before stopping blood thinners      I discussed the assessment and treatment plan with the patient. The patient was provided an opportunity to ask questions and all were answered. The patient agreed  with the plan and demonstrated an understanding of the instructions. The patient was advised to call back or seek an in-person evaluation if the symptoms worsen or if the condition fails to improve as anticipated.   I provided 20 minutes of non-face-to-face time during this encounter.  This includes time for charting and coordination of care   Naomi MARLA Chad, MD

## 2024-05-11 NOTE — Assessment & Plan Note (Signed)
 03/09/2024: Right leg: Evidence of acute nonocclusive, DVT with reflux in the femoral vein distal thigh and popliteal vein.  [facility] 2008: Right ankle fracture resulted in blood clot in the right leg but it appears to be superficial and was treated with aspirin.  Workup for blood clots: Negative for lupus anticoagulant, Antithrombin III : 94%, protein S: 94%, protein C: 110%, factor V Leiden: Negative, prothrombin gene mutation: Negative  Treatment plan: 6 months of anticoagulation and checking for ultrasound and a D-dimer before stopping blood thinners

## 2024-11-09 ENCOUNTER — Ambulatory Visit (HOSPITAL_COMMUNITY)
